# Patient Record
Sex: Female | Born: 1985 | Hispanic: Yes | Marital: Married | Smoking: Never smoker
Health system: Southern US, Community
[De-identification: ages and names within clinical notes are randomized; demographics above are authoritative.]

---

## 2015-07-09 ENCOUNTER — Other Ambulatory Visit (HOSPITAL_COMMUNITY): Payer: Self-pay | Admitting: Nurse Practitioner

## 2015-07-09 DIAGNOSIS — R102 Pelvic and perineal pain: Secondary | ICD-10-CM

## 2015-07-09 DIAGNOSIS — R109 Unspecified abdominal pain: Secondary | ICD-10-CM

## 2015-07-13 ENCOUNTER — Ambulatory Visit (HOSPITAL_COMMUNITY): Admission: RE | Admit: 2015-07-13 | Payer: Self-pay | Source: Ambulatory Visit

## 2015-07-13 ENCOUNTER — Other Ambulatory Visit (HOSPITAL_COMMUNITY): Payer: Self-pay

## 2017-10-01 ENCOUNTER — Other Ambulatory Visit: Payer: Self-pay | Admitting: Adult Health

## 2017-10-16 ENCOUNTER — Other Ambulatory Visit (HOSPITAL_COMMUNITY)
Admission: RE | Admit: 2017-10-16 | Discharge: 2017-10-16 | Disposition: A | Discharge status: Home / Self Care | Payer: BLUE CROSS/BLUE SHIELD | Source: Ambulatory Visit | Attending: Adult Health | Admitting: Adult Health

## 2017-10-16 ENCOUNTER — Encounter: Payer: Self-pay | Admitting: Adult Health

## 2017-10-16 ENCOUNTER — Ambulatory Visit: Payer: BLUE CROSS/BLUE SHIELD | Admitting: Adult Health

## 2017-10-16 VITALS — BP 114/70 | HR 78 | Ht 63.0 in | Wt 175.0 lb

## 2017-10-16 DIAGNOSIS — Z01419 Encounter for gynecological examination (general) (routine) without abnormal findings: Secondary | ICD-10-CM | POA: Insufficient documentation

## 2017-10-16 DIAGNOSIS — Z975 Presence of (intrauterine) contraceptive device: Secondary | ICD-10-CM | POA: Insufficient documentation

## 2017-10-16 DIAGNOSIS — R635 Abnormal weight gain: Secondary | ICD-10-CM

## 2017-10-16 NOTE — Progress Notes (Signed)
Patient ID: Diana Blackburn, female   DOB: [birth year], 31 y.o.   MRN: [id number] History of Present Illness:  Diana Blackburn is a 31 year old female,mexican-american, married in for well woman gyn exam and pap.She is new to this practice.She complains of cramping with IUD, does not have period since inserted, and has vaginal discharge at times. And weight gain of 30 lbs over last 3 years and vision blurry at esp at night.  Current Medications, Allergies, Past Medical History, Past Surgical History, Family History and Social History were reviewed in [employer] record.     Review of Systems:  Patient denies any headaches, hearing loss, fatigue, blurred vision, shortness of breath, chest pain, abdominal pain, problems with bowel movements, urination, or intercourse. No joint pain or mood swings.See HPI for positives    Physical Exam:BP 114/70 (BP Location: Left Arm, Patient Position: Sitting, Cuff Size: Normal)   Pulse 78   Ht 5\' 3"  (1.6 m)   Wt 175 lb (79.4 kg)   BMI 31.00 kg/m  General:  Well developed, well nourished, no acute distress Skin:  Warm and dry Neck:  Midline trachea, normal thyroid, good ROM, no lymphadenopathy Lungs; Clear to auscultation bilaterally Breast:  No dominant palpable mass, retraction, or nipple discharge Cardiovascular: Regular rate and rhythm Abdomen:  Soft, non tender, no hepatosplenomegaly Pelvic:  External genitalia is normal in appearance, no lesions.  The vagina is normal in appearance. Urethra has no lesions or masses. The cervix is bulbous.+IUD strings at os, pap with GC/CHL and HPV performed.  Uterus is felt to be normal size, shape, and contour.  No adnexal masses or tenderness noted.Bladder is non tender, no masses felt. Extremities/musculoskeletal:  No swelling or varicosities noted, no clubbing or cyanosis Psych:  No mood changes, alert and cooperative,seems happy PHQ 2 score 0.She may want tubal in near future and have IUD removed.    Impression: 1. Encounter for gynecological examination with Papanicolaou smear of cervix   2. IUD (intrauterine device) in place   3. Weight gain       Plan: Check CBC,CMP,TSH and lipids Physical in 1 year Pap in 3 if normal Mammogram at 41 Review handout on tubal by [facility]  Call if wants tubal and Angie can check insurance  See eye doctor

## 2017-10-18 LAB — CYTOLOGY - PAP
CHLAMYDIA, DNA PROBE: NEGATIVE
Diagnosis: NEGATIVE
HPV (WINDOPATH): NOT DETECTED
NEISSERIA GONORRHEA: NEGATIVE

## 2018-04-27 DIAGNOSIS — Z6829 Body mass index (BMI) 29.0-29.9, adult: Secondary | ICD-10-CM | POA: Diagnosis not present

## 2018-04-27 DIAGNOSIS — B309 Viral conjunctivitis, unspecified: Secondary | ICD-10-CM | POA: Diagnosis not present

## 2018-04-27 DIAGNOSIS — J0100 Acute maxillary sinusitis, unspecified: Secondary | ICD-10-CM | POA: Diagnosis not present

## 2019-01-14 ENCOUNTER — Encounter: Payer: Self-pay | Admitting: Adult Health

## 2019-01-14 ENCOUNTER — Ambulatory Visit (INDEPENDENT_AMBULATORY_CARE_PROVIDER_SITE_OTHER): Payer: BLUE CROSS/BLUE SHIELD | Admitting: Adult Health

## 2019-01-14 ENCOUNTER — Other Ambulatory Visit: Payer: Self-pay

## 2019-01-14 VITALS — BP 124/90 | HR 70 | Ht 63.0 in | Wt 178.4 lb

## 2019-01-14 DIAGNOSIS — N6313 Unspecified lump in the right breast, lower outer quadrant: Secondary | ICD-10-CM | POA: Insufficient documentation

## 2019-01-14 DIAGNOSIS — Z975 Presence of (intrauterine) contraceptive device: Secondary | ICD-10-CM

## 2019-01-14 DIAGNOSIS — Z01419 Encounter for gynecological examination (general) (routine) without abnormal findings: Secondary | ICD-10-CM | POA: Diagnosis not present

## 2019-01-14 NOTE — Progress Notes (Signed)
Patient ID: Diana Blackburn, female   DOB: 12-Aug-1986, 33 y.o.   MRN: [id number] History of Present Illness: Diana Blackburn is a 33 year old Hispanic female, married, G2P2, in for a well woman gyn exam, she had a normal pap with negative HPV 10/18/17.Has noticed right breast mass for about a month and has pain in breasts at times. PCP is Dr Verdie Drown.    Current Medications, Allergies, Past Medical History, Past Surgical History, Family History and Social History were reviewed in [employer] record.     Review of Systems:  Patient denies any daily headaches,cough or fever, hearing loss, fatigue, blurred vision, shortness of breath, chest pain, abdominal pain, problems with bowel movements, urination, or intercourse. No joint pain or mood swings. +right breast mass x 1 month, and pain in breasts at times. She has IUD(due to be removed in June) and thinks she wants a tubal.   Physical Exam:BP 124/90 (BP Location: Left Arm, Patient Position: Sitting, Cuff Size: Normal)   Pulse 70   Ht 5\' 3"  (1.6 m)   Wt 178 lb 6.4 oz (80.9 kg)   BMI 31.60 kg/m  General:  Well developed, well nourished, no acute distress Skin:  Warm and dry Neck:  Midline trachea, normal thyroid, good ROM, no lymphadenopathy Lungs; Clear to auscultation bilaterally Breast:  No dominant palpable mass, retraction, or nipple discharge on the left, no retraction or nipple discharge on the right, but has pea sized mass at 3-4 o'clock that is tender and about 5 FB from the nipple. Cardiovascular: Regular rate and rhythm Abdomen:  Soft, non tender, no hepatosplenomegaly Pelvic:  External genitalia is normal in appearance, no lesions.  The vagina is normal in appearance. Urethra has no lesions or masses. The cervix is bulbous. +IUD strings.  Uterus is felt to be normal size, shape, and contour.  No adnexal masses or tenderness noted.Bladder is non tender, no masses felt. Extremities/musculoskeletal:  No swelling or  varicosities noted, no clubbing or cyanosis Psych:  No mood changes, alert and cooperative,seems happy Fall risk is low. PHQ 2 score o. Examination chaperoned by Estill Bamberg Rash LPN.  Will get diagnostic mammogram and [place] scheduled.   Impression: 1. Encounter for well woman exam with routine gynecological exam   2. IUD (intrauterine device) in place   3. Mass of lower outer quadrant of right breast       Plan: Diagnostic mammogram and [place] scheduled 3/31 at 9 am at [place] handout on tubal sterilization by Tilden Fossa  Return in 2 months for pre op on tubal with Dr Elonda Husky Pap and physical in 1 year  Labs with PCP

## 2019-01-28 ENCOUNTER — Ambulatory Visit (HOSPITAL_COMMUNITY): Admission: RE | Admit: 2019-01-28 | Payer: BLUE CROSS/BLUE SHIELD | Source: Ambulatory Visit

## 2019-01-28 ENCOUNTER — Ambulatory Visit (HOSPITAL_COMMUNITY)
Admission: RE | Admit: 2019-01-28 | Discharge: 2019-01-28 | Disposition: A | Discharge status: Home / Self Care | Payer: BLUE CROSS/BLUE SHIELD | Source: Ambulatory Visit | Attending: Adult Health | Admitting: Adult Health

## 2019-01-28 ENCOUNTER — Other Ambulatory Visit: Payer: Self-pay

## 2019-01-28 ENCOUNTER — Encounter (HOSPITAL_COMMUNITY): Payer: Self-pay

## 2019-01-28 DIAGNOSIS — N6312 Unspecified lump in the right breast, upper inner quadrant: Secondary | ICD-10-CM | POA: Diagnosis not present

## 2019-01-28 DIAGNOSIS — N6313 Unspecified lump in the right breast, lower outer quadrant: Secondary | ICD-10-CM | POA: Insufficient documentation

## 2019-03-14 ENCOUNTER — Telehealth: Payer: Self-pay | Admitting: Obstetrics & Gynecology

## 2019-03-14 NOTE — Telephone Encounter (Signed)
Spoke with patient and pre screened her for the Covid-19, also I let patient know about our restrictions that we have in place and to please wear a mask for her appointment

## 2019-03-17 ENCOUNTER — Encounter: Payer: Self-pay | Admitting: Obstetrics & Gynecology

## 2019-03-17 ENCOUNTER — Other Ambulatory Visit: Payer: Self-pay

## 2019-03-17 ENCOUNTER — Ambulatory Visit (INDEPENDENT_AMBULATORY_CARE_PROVIDER_SITE_OTHER): Payer: BLUE CROSS/BLUE SHIELD | Admitting: Obstetrics & Gynecology

## 2019-03-17 VITALS — BP 116/78 | HR 77 | Ht 63.0 in | Wt 183.0 lb

## 2019-03-17 DIAGNOSIS — Z3009 Encounter for other general counseling and advice on contraception: Secondary | ICD-10-CM | POA: Diagnosis not present

## 2019-03-17 NOTE — Progress Notes (Signed)
Preoperative History and Physical  Diana Blackburn is a 33 y.o. [id number] with Patient's last menstrual period was 03/11/2019. admitted for a laparoscopic bilateral tubal ligation using electrocautery and removal of IUD.    PMH:   History reviewed. No pertinent past medical history.  PSH:    History reviewed. No pertinent surgical history.  POb/GynH:      OB History    Gravida  2   Para  2   Term      Preterm  2   AB      Living  2     SAB      TAB      Ectopic      Multiple      Live Births  2           SH:   Social History   Tobacco Use  . Smoking status: Never Smoker  . Smokeless tobacco: Never Used  Substance Use Topics  . Alcohol use: No    Frequency: Never  . Drug use: No    FH:    Family History  Problem Relation Age of Onset  . Cancer Paternal Grandfather   . Other Maternal Grandmother        enlarged heart  . Hypertension Maternal Grandmother   . Diabetes Maternal Grandfather      Allergies: No Known Allergies  Medications:       Current Outpatient Medications:  .  levonorgestrel (MIRENA) 20 MCG/24HR IUD, 1 each by Intrauterine route once., Disp: , Rfl:   Review of Systems:   Review of Systems  Constitutional: Negative for fever, chills, weight loss, malaise/fatigue and diaphoresis.  HENT: Negative for hearing loss, ear pain, nosebleeds, congestion, sore throat, neck pain, tinnitus and ear discharge.   Eyes: Negative for blurred vision, double vision, photophobia, pain, discharge and redness.  Respiratory: Negative for cough, hemoptysis, sputum production, shortness of breath, wheezing and stridor.   Cardiovascular: Negative for chest pain, palpitations, orthopnea, claudication, leg swelling and PND.  Gastrointestinal: Positive for abdominal pain. Negative for heartburn, nausea, vomiting, diarrhea, constipation, blood in stool and melena.  Genitourinary: Negative for dysuria, urgency, frequency, hematuria and flank pain.   Musculoskeletal: Negative for myalgias, back pain, joint pain and falls.  Skin: Negative for itching and rash.  Neurological: Negative for dizziness, tingling, tremors, sensory change, speech change, focal weakness, seizures, loss of consciousness, weakness and headaches.  Endo/Heme/Allergies: Negative for environmental allergies and polydipsia. Does not bruise/bleed easily.  Psychiatric/Behavioral: Negative for depression, suicidal ideas, hallucinations, memory loss and substance abuse. The patient is not nervous/anxious and does not have insomnia.      PHYSICAL EXAM:  Blood pressure 116/78, pulse 77, height 5\' 3"  (1.6 m), weight 183 lb (83 kg), last menstrual period 03/11/2019.    Vitals reviewed. Constitutional: She is oriented to person, place, and time. She appears well-developed and well-nourished.  HENT:  Head: Normocephalic and atraumatic.  Right Ear: External ear normal.  Left Ear: External ear normal.  Nose: Nose normal.  Mouth/Throat: Oropharynx is clear and moist.  Eyes: Conjunctivae and EOM are normal. Pupils are equal, round, and reactive to light. Right eye exhibits no discharge. Left eye exhibits no discharge. No scleral icterus.  Neck: Normal range of motion. Neck supple. No tracheal deviation present. No thyromegaly present.  Cardiovascular: Normal rate, regular rhythm, normal heart sounds and intact distal pulses.  Exam reveals no gallop and no friction rub.   No murmur heard. Respiratory: Effort normal and breath sounds normal.  No respiratory distress. She has no wheezes. She has no rales. She exhibits no tenderness.  GI: Soft. Bowel sounds are normal. She exhibits no distension and no mass. There is tenderness. There is no rebound and no guarding.  Genitourinary:       Vulva is normal without lesions Vagina is pink moist without discharge Cervix normal in appearance and pap is normal Uterus is normal size, contour, position, consistency, mobility,  non-tender Adnexa is negative with normal sized ovaries by sonogram  Musculoskeletal: Normal range of motion. She exhibits no edema and no tenderness.  Neurological: She is alert and oriented to person, place, and time. She has normal reflexes. She displays normal reflexes. No cranial nerve deficit. She exhibits normal muscle tone. Coordination normal.  Skin: Skin is warm and dry. No rash noted. No erythema. No pallor.  Psychiatric: She has a normal mood and affect. Her behavior is normal. Judgment and thought content normal.    Labs: No results found for this or any previous visit (from the past 336 hour(s)).  EKG: No orders found for this or any previous visit.  Imaging Studies: No results found.    Assessment: Multiparous female desires permanent sterilization  Plan: Laparoscopic bilateral tubal ligation using electrocautery(patient choice), removal of IUD 05/07/2019  Florian Buff 03/17/2019 4:36 PM

## 2019-04-25 NOTE — Patient Instructions (Signed)
Your procedure is scheduled on: 05/07/2019  Report to [facility] at  10:15     AM.  Call this number if you have problems the morning of surgery: [phone]   Remember:   Do not Eat or Drink after midnight   :    Do not wear jewelry, make-up or nail polish.  Do not wear lotions, powders, or perfumes. You may wear deodorant.  Do not shave 48 hours prior to surgery. Men may shave face and neck.  Do not bring valuables to the hospital.  Contacts, dentures or bridgework may not be worn into surgery.  Leave suitcase in the car. After surgery it may be brought to your room.  For patients admitted to the hospital, checkout time is 11:00 AM the day of discharge.   Patients discharged the day of surgery will not be allowed to drive home.    Special Instructions: Shower using CHG night before surgery and shower the day of surgery use CHG.  Use special wash - you have one bottle of CHG for all showers.  You should use approximately 1/2 of the bottle for each shower.  Laparoscopic Tubal Ligation Laparoscopic tubal ligation is a procedure to close the fallopian tubes. This is done so that you cannot get pregnant. When the fallopian tubes are closed, the eggs that your ovaries release cannot enter the uterus, and sperm cannot reach the released eggs. You should not have this procedure if you want to get pregnant someday or if you are unsure about having more children. Tell a health care provider about:  Any allergies you have.  All medicines you are taking, including vitamins, herbs, eye drops, creams, and over-the-counter medicines.  Any problems you or family members have had with anesthetic medicines.  Any blood disorders you have.  Any surgeries you have had.  Any medical conditions you have.  Whether you are pregnant or may be pregnant.  Any past pregnancies. What are the risks? Generally, this is a safe procedure. However, problems may occur, including:  Infection.  Bleeding.   Injury to other organs in the abdomen.  Side effects from anesthetic medicines.  Failure of the procedure. This procedure can increase your risk of a kind of pregnancy in which a fertilized egg attaches to the outside of the uterus (ectopic pregnancy). What happens before the procedure? Medicines  Ask your health care provider about: ? Changing or stopping your regular medicines. This is especially important if you are taking diabetes medicines or blood thinners. ? Taking medicines such as aspirin and ibuprofen. These medicines can thin your blood. Do not take these medicines unless your health care provider tells you to take them. ? Taking over-the-counter medicines, vitamins, herbs, and supplements. Staying hydrated  Follow instructions from your health care provider about hydration, which may include: ? Up to 2 hours before the procedure - you may continue to drink clear liquids, such as water, clear fruit juice, black coffee, and plain tea. Eating and drinking  Follow instructions from your health care provider about eating and drinking, which may include: ? 8 hours before the procedure - stop eating heavy meals or foods, such as meat, fried foods, or fatty foods. ? 6 hours before the procedure - stop eating light meals or foods, such as toast or cereal. ? 6 hours before the procedure - stop drinking milk or drinks that contain milk. ? 2 hours before the procedure - stop drinking clear liquids. General instructions  Do not use any  products that contain nicotine or tobacco for at least 4 weeks before the procedure. These products include cigarettes, e-cigarettes, and chewing tobacco. If you need help quitting, ask your health care provider.  Plan to have someone take you home from the hospital.  If you will be going home right after the procedure, plan to have someone with you for 24 hours.  Ask your health care provider: ? How your surgery site will be marked. ? What steps  will be taken to help prevent infection. These may include:  Removing hair at the surgery site.  Washing skin with a germ-killing soap.  Taking antibiotic medicine. What happens during the procedure?      An IV will be inserted into one of your veins.  You will be given one or more of the following: ? A medicine to help you relax (sedative). ? A medicine to numb the area (local anesthetic). ? A medicine to make you fall asleep (general anesthetic). ? A medicine that is injected into an area of your body to numb everything below the injection site (regional anesthetic).  Your bladder may be emptied with a small tube (catheter).  If you have been given a general anesthetic, a tube will be put down your throat to help you breathe.  Two small incisions will be made in your lower abdomen and near your belly button.  Your abdomen will be inflated with a gas. This will let the surgeon see better and will give the surgeon room to work.  A thin, lighted tube (laparoscope) with a camera attached will be inserted into your abdomen through one of the incisions. Small instruments will be inserted through the other incision.  The fallopian tubes will be tied off, burned (cauterized), or blocked with a clip, ring, or clamp. A small portion in the center of each fallopian tube may be removed.  The gas will be released from the abdomen.  The incisions will be closed with stitches (sutures).  A bandage (dressing) will be placed over the incisions. The procedure may vary among health care providers and hospitals. What happens after the procedure?  Your blood pressure, heart rate, breathing rate, and blood oxygen level will be monitored until you leave the hospital.  You will be given medicine to help with pain, nausea, and vomiting as needed. Summary  Laparoscopic tubal ligation is a procedure that is done so that you cannot get pregnant.  You should not have this procedure if you want to  get pregnant someday or if you are unsure about having more children.  The procedure is done using a thin, lighted tube (laparoscope) with a camera attached that will be inserted into your abdomen through an incision.  Follow instructions from your health care provider about eating and drinking before the procedure. This information is not intended to replace advice given to you by your health care provider. Make sure you discuss any questions you have with your health care provider. Document Released: 01/22/2001 Document Revised: 09/10/2018 Document Reviewed: 09/10/2018 Elsevier Patient Education  [place].  Laparoscopic Tubal Ligation, Care After This sheet gives you information about how to care for yourself after your procedure. Your health care provider may also give you more specific instructions. If you have problems or questions, contact your health care provider. What can I expect after the procedure? After the procedure, it is common to have:  A sore throat.  Discomfort in your shoulder.  Mild discomfort or cramping in your abdomen.  Gas pains.  Pain or soreness in the area where the surgical incision was made.  A bloated feeling.  Tiredness.  Nausea.  Vomiting. Follow these instructions at home: Medicines  Take over-the-counter and prescription medicines only as told by your health care provider.  Do not take aspirin because it can cause bleeding.  Ask your health care provider if the medicine prescribed to you: ? Requires you to avoid driving or using heavy machinery. ? Can cause constipation. You may need to take actions to prevent or treat constipation, such as:  Drink enough fluid to keep your urine pale yellow.  Take over-the-counter or prescription medicines.  Eat foods that are high in fiber, such as beans, whole grains, and fresh fruits and vegetables.  Limit foods that are high in fat and processed sugars, such as fried or sweet foods.  Incision care      Follow instructions from your health care provider about how to take care of your incision. Make sure you: ? Wash your hands with soap and water before and after you change your bandage (dressing). If soap and water are not available, use hand sanitizer. ? Change your dressing as told by your health care provider. ? Leave stitches (sutures), skin glue, or adhesive strips in place. These skin closures may need to stay in place for 2 weeks or longer. If adhesive strip edges start to loosen and curl up, you may trim the loose edges. Do not remove adhesive strips completely unless your health care provider tells you to do that.  Check your incision area every day for signs of infection. Check for: ? Redness, swelling, or pain. ? Fluid or blood. ? Warmth. ? Pus or a bad smell. Activity  Rest as told by your health care provider.  Avoid sitting for a long time without moving. Get up to take short walks every 1-2 hours. This is important to improve blood flow and breathing. Ask for help if you feel weak or unsteady.  Return to your normal activities as told by your health care provider. Ask your health care provider what activities are safe for you. General instructions  Do not take baths, swim, or use a hot tub until your health care provider approves. Ask your health care provider if you may take showers. You may only be allowed to take sponge baths.  Have someone help you with your daily household tasks for the first few days.  Keep all follow-up visits as told by your health care provider. This is important. Contact a health care provider if:  You have redness, swelling, or pain around your incision.  Your incision feels warm to the touch.  You have pus or a bad smell coming from your incision.  The edges of your incision break open after the sutures have been removed.  Your pain does not improve after 2-3 days.  You have a rash.  You repeatedly become  dizzy or light-headed.  Your pain medicine is not helping. Get help right away if you:  Have a fever.  Faint.  Have increasing pain in your abdomen.  Have severe pain in one or both of your shoulders.  Have fluid or blood coming from your sutures or from your vagina.  Have shortness of breath or difficulty breathing.  Have chest pain or leg pain.  Have ongoing nausea, vomiting, or diarrhea. Summary  After the procedure, it is common to have mild discomfort or cramping in your abdomen.  Take over-the-counter and prescription medicines  only as told by your health care provider.  Watch for symptoms that should prompt you to call your health care provider.  Keep all follow-up visits as told by your health care provider. This is important. This information is not intended to replace advice given to you by your health care provider. Make sure you discuss any questions you have with your health care provider. Document Released: 05/05/2005 Document Revised: 09/10/2018 Document Reviewed: 09/10/2018 Elsevier Patient Education  2020 [place] Anesthesia, Adult, Care After This sheet gives you information about how to care for yourself after your procedure. Your health care provider may also give you more specific instructions. If you have problems or questions, contact your health care provider. What can I expect after the procedure? After the procedure, the following side effects are common:  Pain or discomfort at the IV site.  Nausea.  Vomiting.  Sore throat.  Trouble concentrating.  Feeling cold or chills.  Weak or tired.  Sleepiness and fatigue.  Soreness and body aches. These side effects can affect parts of the body that were not involved in surgery. Follow these instructions at home:  For at least 24 hours after the procedure:  Have a responsible adult stay with you. It is important to have someone help care for you until you are awake and alert.   Rest as needed.  Do not: ? Participate in activities in which you could fall or become injured. ? Drive. ? Use heavy machinery. ? Drink alcohol. ? Take sleeping pills or medicines that cause drowsiness. ? Make important decisions or sign legal documents. ? Take care of children on your own. Eating and drinking  Follow any instructions from your health care provider about eating or drinking restrictions.  When you feel hungry, start by eating small amounts of foods that are soft and easy to digest (bland), such as toast. Gradually return to your regular diet.  Drink enough fluid to keep your urine pale yellow.  If you vomit, rehydrate by drinking water, juice, or clear broth. General instructions  If you have sleep apnea, surgery and certain medicines can increase your risk for breathing problems. Follow instructions from your health care provider about wearing your sleep device: ? Anytime you are sleeping, including during daytime naps. ? While taking prescription pain medicines, sleeping medicines, or medicines that make you drowsy.  Return to your normal activities as told by your health care provider. Ask your health care provider what activities are safe for you.  Take over-the-counter and prescription medicines only as told by your health care provider.  If you smoke, do not smoke without supervision.  Keep all follow-up visits as told by your health care provider. This is important. Contact a health care provider if:  You have nausea or vomiting that does not get better with medicine.  You cannot eat or drink without vomiting.  You have pain that does not get better with medicine.  You are unable to pass urine.  You develop a skin rash.  You have a fever.  You have redness around your IV site that gets worse. Get help right away if:  You have difficulty breathing.  You have chest pain.  You have blood in your urine or stool, or you vomit blood. Summary   After the procedure, it is common to have a sore throat or nausea. It is also common to feel tired.  Have a responsible adult stay with you for the first 24 hours after general  anesthesia. It is important to have someone help care for you until you are awake and alert.  When you feel hungry, start by eating small amounts of foods that are soft and easy to digest (bland), such as toast. Gradually return to your regular diet.  Drink enough fluid to keep your urine pale yellow.  Return to your normal activities as told by your health care provider. Ask your health care provider what activities are safe for you. This information is not intended to replace advice given to you by your health care provider. Make sure you discuss any questions you have with your health care provider. Document Released: 01/22/2001 Document Revised: 06/01/2017 Document Reviewed: 06/01/2017 Elsevier Interactive Patient Education  2019 [employer].

## 2019-05-01 ENCOUNTER — Other Ambulatory Visit: Payer: Self-pay

## 2019-05-01 ENCOUNTER — Encounter (HOSPITAL_COMMUNITY)
Admission: RE | Admit: 2019-05-01 | Discharge: 2019-05-01 | Disposition: A | Discharge status: Home / Self Care | Payer: BLUE CROSS/BLUE SHIELD | Source: Ambulatory Visit | Attending: Obstetrics & Gynecology | Admitting: Obstetrics & Gynecology

## 2019-05-01 ENCOUNTER — Encounter (HOSPITAL_COMMUNITY): Payer: Self-pay

## 2019-05-01 DIAGNOSIS — Z01812 Encounter for preprocedural laboratory examination: Secondary | ICD-10-CM | POA: Diagnosis not present

## 2019-05-01 LAB — CBC
HCT: 38.9 % (ref 36.0–46.0)
Hemoglobin: 12.9 g/dL (ref 12.0–15.0)
MCH: 30.1 pg (ref 26.0–34.0)
MCHC: 33.2 g/dL (ref 30.0–36.0)
MCV: 90.9 fL (ref 80.0–100.0)
Platelets: 302 10*3/uL (ref 150–400)
RBC: 4.28 MIL/uL (ref 3.87–5.11)
RDW: 11.9 % (ref 11.5–15.5)
WBC: 8.6 10*3/uL (ref 4.0–10.5)
nRBC: 0 % (ref 0.0–0.2)

## 2019-05-01 LAB — COMPREHENSIVE METABOLIC PANEL
ALT: 26 U/L (ref 0–44)
AST: 19 U/L (ref 15–41)
Albumin: 4.1 g/dL (ref 3.5–5.0)
Alkaline Phosphatase: 58 U/L (ref 38–126)
Anion gap: 10 (ref 5–15)
BUN: 12 mg/dL (ref 6–20)
CO2: 25 mmol/L (ref 22–32)
Calcium: 9.5 mg/dL (ref 8.9–10.3)
Chloride: 104 mmol/L (ref 98–111)
Creatinine, Ser: 0.53 mg/dL (ref 0.44–1.00)
GFR calc Af Amer: >60 mL/min (ref >60)
GFR calc non Af Amer: >60 mL/min (ref >60)
Glucose, Bld: 94 mg/dL (ref 70–99)
Potassium: 3.9 mmol/L (ref 3.5–5.1)
Sodium: 139 mmol/L (ref 135–145)
Total Bilirubin: 0.9 mg/dL (ref 0.3–1.2)
Total Protein: 7.4 g/dL (ref 6.5–8.1)

## 2019-05-01 LAB — RAPID HIV SCREEN (HIV 1/2 AB+AG)
HIV 1/2 Antibodies: NONREACTIVE
HIV-1 P24 Antigen - HIV24: NONREACTIVE

## 2019-05-01 LAB — URINALYSIS, ROUTINE W REFLEX MICROSCOPIC
Bilirubin Urine: NEGATIVE
Glucose, UA: NEGATIVE mg/dL
Hgb urine dipstick: NEGATIVE
Ketones, ur: 5 mg/dL — AB
Leukocytes,Ua: NEGATIVE
Nitrite: NEGATIVE
Protein, ur: NEGATIVE mg/dL
Specific Gravity, Urine: 1.015 (ref 1.005–1.030)
pH: 7 (ref 5.0–8.0)

## 2019-05-01 LAB — HCG, QUANTITATIVE, PREGNANCY: hCG, Beta Chain, Quant, S: <1 m[IU]/mL (ref <5)

## 2019-05-05 ENCOUNTER — Other Ambulatory Visit: Payer: Self-pay

## 2019-05-05 ENCOUNTER — Other Ambulatory Visit (HOSPITAL_COMMUNITY)
Admission: RE | Admit: 2019-05-05 | Discharge: 2019-05-05 | Disposition: A | Discharge status: Home / Self Care | Payer: BLUE CROSS/BLUE SHIELD | Source: Ambulatory Visit | Attending: Obstetrics & Gynecology | Admitting: Obstetrics & Gynecology

## 2019-05-05 ENCOUNTER — Other Ambulatory Visit: Payer: Self-pay | Admitting: Obstetrics & Gynecology

## 2019-05-05 DIAGNOSIS — Z01812 Encounter for preprocedural laboratory examination: Secondary | ICD-10-CM | POA: Insufficient documentation

## 2019-05-05 DIAGNOSIS — Z1159 Encounter for screening for other viral diseases: Secondary | ICD-10-CM | POA: Insufficient documentation

## 2019-05-06 LAB — SARS CORONAVIRUS 2 (TAT 6-24 HRS): SARS Coronavirus 2: NEGATIVE

## 2019-05-07 ENCOUNTER — Ambulatory Visit (HOSPITAL_COMMUNITY): Payer: BLUE CROSS/BLUE SHIELD | Admitting: Anesthesiology

## 2019-05-07 ENCOUNTER — Other Ambulatory Visit: Payer: Self-pay

## 2019-05-07 ENCOUNTER — Encounter (HOSPITAL_COMMUNITY): Payer: Self-pay | Admitting: *Deleted

## 2019-05-07 ENCOUNTER — Encounter (HOSPITAL_COMMUNITY)
Admission: RE | Disposition: A | Discharge status: Home / Self Care | Payer: Self-pay | Source: Home / Self Care | Attending: Obstetrics & Gynecology

## 2019-05-07 ENCOUNTER — Ambulatory Visit (HOSPITAL_COMMUNITY)
Admission: RE | Admit: 2019-05-07 | Discharge: 2019-05-07 | Disposition: A | Discharge status: Home / Self Care | Payer: BLUE CROSS/BLUE SHIELD | Attending: Obstetrics & Gynecology | Admitting: Obstetrics & Gynecology

## 2019-05-07 DIAGNOSIS — Z30432 Encounter for removal of intrauterine contraceptive device: Secondary | ICD-10-CM | POA: Diagnosis not present

## 2019-05-07 DIAGNOSIS — Z302 Encounter for sterilization: Secondary | ICD-10-CM | POA: Insufficient documentation

## 2019-05-07 HISTORY — PX: LAPAROSCOPIC TUBAL LIGATION: SHX1937

## 2019-05-07 HISTORY — PX: IUD REMOVAL: SHX5392

## 2019-05-07 SURGERY — LIGATION, FALLOPIAN TUBE, LAPAROSCOPIC
Anesthesia: General | Site: Vagina

## 2019-05-07 MED ORDER — HYDROMORPHONE HCL 1 MG/ML IJ SOLN: 0.2500 mg | INTRAMUSCULAR | Status: DC | PRN

## 2019-05-07 MED ORDER — SUGAMMADEX SODIUM 200 MG/2ML IV SOLN
INTRAVENOUS | Status: DC | PRN
  Administered 2019-05-07: 200 mg via INTRAVENOUS

## 2019-05-07 MED ORDER — ROCURONIUM BROMIDE 10 MG/ML (PF) SYRINGE
PREFILLED_SYRINGE | INTRAVENOUS | Status: AC
  Filled 2019-05-07: qty 10

## 2019-05-07 MED ORDER — MIDAZOLAM HCL 2 MG/2ML IJ SOLN: 0.5000 mg | Freq: Once | INTRAMUSCULAR | Status: DC | PRN

## 2019-05-07 MED ORDER — BUPIVACAINE LIPOSOME 1.3 % IJ SUSP
INTRAMUSCULAR | Status: DC | PRN
  Administered 2019-05-07: 20 mL

## 2019-05-07 MED ORDER — MIDAZOLAM HCL 5 MG/5ML IJ SOLN
INTRAMUSCULAR | Status: DC | PRN
  Administered 2019-05-07: 2 mg via INTRAVENOUS

## 2019-05-07 MED ORDER — ONDANSETRON 8 MG PO TBDP: 8.0000 mg | ORAL_TABLET | Freq: Three times a day (TID) | ORAL | 0 refills | Status: DC | PRN

## 2019-05-07 MED ORDER — 0.9 % SODIUM CHLORIDE (POUR BTL) OPTIME
TOPICAL | Status: DC | PRN
  Administered 2019-05-07: 1000 mL

## 2019-05-07 MED ORDER — FENTANYL CITRATE (PF) 100 MCG/2ML IJ SOLN
INTRAMUSCULAR | Status: DC | PRN
  Administered 2019-05-07 (×2): 50 ug via INTRAVENOUS
  Administered 2019-05-07: 100 ug via INTRAVENOUS
  Administered 2019-05-07: 50 ug via INTRAVENOUS

## 2019-05-07 MED ORDER — DEXAMETHASONE SODIUM PHOSPHATE 4 MG/ML IJ SOLN
INTRAMUSCULAR | Status: DC | PRN
  Administered 2019-05-07: 4 mg via INTRAVENOUS

## 2019-05-07 MED ORDER — PROPOFOL 10 MG/ML IV BOLUS
INTRAVENOUS | Status: DC | PRN
  Administered 2019-05-07: 150 mg via INTRAVENOUS

## 2019-05-07 MED ORDER — DEXAMETHASONE SODIUM PHOSPHATE 10 MG/ML IJ SOLN
INTRAMUSCULAR | Status: AC
  Filled 2019-05-07: qty 1

## 2019-05-07 MED ORDER — FENTANYL CITRATE (PF) 250 MCG/5ML IJ SOLN
INTRAMUSCULAR | Status: AC
  Filled 2019-05-07: qty 5

## 2019-05-07 MED ORDER — ONDANSETRON HCL 4 MG/2ML IJ SOLN
INTRAMUSCULAR | Status: AC
  Filled 2019-05-07: qty 2

## 2019-05-07 MED ORDER — PROMETHAZINE HCL 25 MG/ML IJ SOLN: 6.2500 mg | INTRAMUSCULAR | Status: DC | PRN

## 2019-05-07 MED ORDER — MIDAZOLAM HCL 2 MG/2ML IJ SOLN
INTRAMUSCULAR | Status: AC
  Filled 2019-05-07: qty 2

## 2019-05-07 MED ORDER — KETOROLAC TROMETHAMINE 10 MG PO TABS: 10.0000 mg | ORAL_TABLET | Freq: Three times a day (TID) | ORAL | 0 refills | Status: DC | PRN

## 2019-05-07 MED ORDER — ROCURONIUM BROMIDE 100 MG/10ML IV SOLN
INTRAVENOUS | Status: DC | PRN
  Administered 2019-05-07: 50 mg via INTRAVENOUS

## 2019-05-07 MED ORDER — LACTATED RINGERS IV SOLN
INTRAVENOUS | Status: DC
  Administered 2019-05-07: 11:00:00 1000 mL via INTRAVENOUS

## 2019-05-07 MED ORDER — ONDANSETRON HCL 4 MG/2ML IJ SOLN
INTRAMUSCULAR | Status: DC | PRN
  Administered 2019-05-07: 4 mg via INTRAVENOUS

## 2019-05-07 MED ORDER — BUPIVACAINE LIPOSOME 1.3 % IJ SUSP
INTRAMUSCULAR | Status: AC
  Filled 2019-05-07: qty 20

## 2019-05-07 MED ORDER — KETOROLAC TROMETHAMINE 30 MG/ML IJ SOLN
30.0000 mg | Freq: Once | INTRAMUSCULAR | Status: AC
  Administered 2019-05-07: 30 mg via INTRAVENOUS
  Filled 2019-05-07: qty 1

## 2019-05-07 MED ORDER — HYDROCODONE-ACETAMINOPHEN 5-325 MG PO TABS: 1.0000 | ORAL_TABLET | Freq: Four times a day (QID) | ORAL | 0 refills | Status: DC | PRN

## 2019-05-07 MED ORDER — CEFAZOLIN SODIUM-DEXTROSE 2-4 GM/100ML-% IV SOLN
2.0000 g | INTRAVENOUS | Status: AC
  Administered 2019-05-07: 11:00:00 2 g via INTRAVENOUS
  Filled 2019-05-07: qty 100

## 2019-05-07 MED ORDER — HYDROCODONE-ACETAMINOPHEN 7.5-325 MG PO TABS: 1.0000 | ORAL_TABLET | Freq: Once | ORAL | Status: DC | PRN

## 2019-05-07 SURGICAL SUPPLY — 35 items
BLADE SURG SZ11 CARB STEEL (BLADE) ×3 IMPLANT
CLOTH BEACON ORANGE TIMEOUT ST (SAFETY) ×3 IMPLANT
COVER LIGHT HANDLE STERIS (MISCELLANEOUS) ×6 IMPLANT
COVER WAND RF STERILE (DRAPES) ×3 IMPLANT
ELECT REM PT RETURN 9FT ADLT (ELECTROSURGICAL) ×3
ELECTRODE REM PT RTRN 9FT ADLT (ELECTROSURGICAL) ×2 IMPLANT
GAUZE 4X4 16PLY RFD (DISPOSABLE) ×3 IMPLANT
GLOVE BIO SURGEON STRL SZ7 (GLOVE) ×1 IMPLANT
GLOVE BIOGEL PI IND STRL 7.0 (GLOVE) ×6 IMPLANT
GLOVE BIOGEL PI IND STRL 8 (GLOVE) ×2 IMPLANT
GLOVE BIOGEL PI INDICATOR 7.0 (GLOVE) ×3
GLOVE BIOGEL PI INDICATOR 8 (GLOVE) ×1
GLOVE ECLIPSE 8.0 STRL XLNG CF (GLOVE) ×3 IMPLANT
GOWN STRL REUS W/TWL LRG LVL3 (GOWN DISPOSABLE) ×3 IMPLANT
GOWN STRL REUS W/TWL XL LVL3 (GOWN DISPOSABLE) ×3 IMPLANT
KIT TURNOVER CYSTO (KITS) ×3 IMPLANT
MANIFOLD NEPTUNE II (INSTRUMENTS) ×3 IMPLANT
NDL INSUFFLATION 14GA 120MM (NEEDLE) ×2 IMPLANT
NEEDLE HYPO 22GX1.5 SAFETY (NEEDLE) ×3 IMPLANT
NEEDLE INSUFFLATION 14GA 120MM (NEEDLE) ×3 IMPLANT
NS IRRIG 1000ML POUR BTL (IV SOLUTION) ×3 IMPLANT
PACK PERI GYN (CUSTOM PROCEDURE TRAY) ×3 IMPLANT
PAD ARMBOARD 7.5X6 YLW CONV (MISCELLANEOUS) ×3 IMPLANT
SET BASIN LINEN APH (SET/KITS/TRAYS/PACK) ×3 IMPLANT
SOLUTION ANTI FOG 6CC (MISCELLANEOUS) ×3 IMPLANT
SPONGE GAUZE 2X2 8PLY STRL LF (GAUZE/BANDAGES/DRESSINGS) ×5 IMPLANT
STAPLER VISISTAT 35W (STAPLE) ×3 IMPLANT
SUT VICRYL 0 UR6 27IN ABS (SUTURE) ×3 IMPLANT
SYR 10ML LL (SYRINGE) ×3 IMPLANT
SYRINGE 20CC LL (MISCELLANEOUS) ×3 IMPLANT
TAPE PAPER 2X10 WHT MICROPORE (GAUZE/BANDAGES/DRESSINGS) ×1 IMPLANT
TROCAR ENDO BLADELESS 11MM (ENDOMECHANICALS) ×3 IMPLANT
TROCAR XCEL NON-BLD 5MMX100MML (ENDOMECHANICALS) ×3 IMPLANT
TUBING INSUF HEATED (TUBING) ×3 IMPLANT
WARMER LAPAROSCOPE (MISCELLANEOUS) ×3 IMPLANT

## 2019-05-07 NOTE — Anesthesia Postprocedure Evaluation (Signed)
Anesthesia Post Note  Patient: Diana Blackburn  Procedure(s) Performed: LAPAROSCOPIC TUBAL LIGATION (Bilateral Abdomen) INTRAUTERINE DEVICE (IUD) REMOVAL (N/A Vagina )  Patient location during evaluation: PACU Anesthesia Type: General Level of consciousness: awake and alert, oriented and awake Pain management: pain level controlled Vital Signs Assessment: post-procedure vital signs reviewed and stable Respiratory status: spontaneous breathing, respiratory function stable and nonlabored ventilation Cardiovascular status: stable Postop Assessment: no apparent nausea or vomiting Anesthetic complications: no     Last Vitals:  Vitals:   05/07/19 1055 05/07/19 1100  BP: 116/73 120/76  Resp:    Temp:    SpO2:      Last Pain:  Vitals:   05/07/19 1033  TempSrc: Oral  PainSc: 0-No pain                 Deandra Goering

## 2019-05-07 NOTE — Discharge Instructions (Signed)
Laparoscopic Tubal Ligation, Care After This sheet gives you information about how to care for yourself after your procedure. Your health care provider may also give you more specific instructions. If you have problems or questions, contact your health care provider. What can I expect after the procedure? After the procedure, it is common to have:  A sore throat.  Discomfort in your shoulder.  Mild discomfort or cramping in your abdomen.  Gas pains.  Pain or soreness in the area where the surgical incision was made.  A bloated feeling.  Tiredness.  Nausea.  Vomiting. Follow these instructions at home: Medicines  Take over-the-counter and prescription medicines only as told by your health care provider.  Do not take aspirin because it can cause bleeding.  Ask your health care provider if the medicine prescribed to you: ? Requires you to avoid driving or using heavy machinery. ? Can cause constipation. You may need to take actions to prevent or treat constipation, such as:  Drink enough fluid to keep your urine pale yellow.  Take over-the-counter or prescription medicines.  Eat foods that are high in fiber, such as beans, whole grains, and fresh fruits and vegetables.  Limit foods that are high in fat and processed sugars, such as fried or sweet foods. Incision care      Follow instructions from your health care provider about how to take care of your incision. Make sure you: ? Wash your hands with soap and water before and after you change your bandage (dressing). If soap and water are not available, use hand sanitizer. ? Change your dressing as told by your health care provider. ? Leave stitches (sutures), skin glue, or adhesive strips in place. These skin closures may need to stay in place for 2 weeks or longer. If adhesive strip edges start to loosen and curl up, you may trim the loose edges. Do not remove adhesive strips completely unless your health care provider  tells you to do that.  Check your incision area every day for signs of infection. Check for: ? Redness, swelling, or pain. ? Fluid or blood. ? Warmth. ? Pus or a bad smell. Activity  Rest as told by your health care provider.  Avoid sitting for a long time without moving. Get up to take short walks every 1-2 hours. This is important to improve blood flow and breathing. Ask for help if you feel weak or unsteady.  Return to your normal activities as told by your health care provider. Ask your health care provider what activities are safe for you. General instructions  Do not take baths, swim, or use a hot tub until your health care provider approves. Ask your health care provider if you may take showers. You may only be allowed to take sponge baths.  Have someone help you with your daily household tasks for the first few days.  Keep all follow-up visits as told by your health care provider. This is important. Contact a health care provider if:  You have redness, swelling, or pain around your incision.  Your incision feels warm to the touch.  You have pus or a bad smell coming from your incision.  The edges of your incision break open after the sutures have been removed.  Your pain does not improve after 2-3 days.  You have a rash.  You repeatedly become dizzy or light-headed.  Your pain medicine is not helping. Get help right away if you:  Have a fever.  Faint.  Have increasing  pain in your abdomen.  Have severe pain in one or both of your shoulders.  Have fluid or blood coming from your sutures or from your vagina.  Have shortness of breath or difficulty breathing.  Have chest pain or leg pain.  Have ongoing nausea, vomiting, or diarrhea. Summary  After the procedure, it is common to have mild discomfort or cramping in your abdomen.  Take over-the-counter and prescription medicines only as told by your health care provider.  Watch for symptoms that should  prompt you to call your health care provider.  Keep all follow-up visits as told by your health care provider. This is important. This information is not intended to replace advice given to you by your health care provider. Make sure you discuss any questions you have with your health care provider. Document Released: 05/05/2005 Document Revised: 09/10/2018 Document Reviewed: 09/10/2018 Elsevier Patient Education  2020 [employer].

## 2019-05-07 NOTE — Transfer of Care (Signed)
Immediate Anesthesia Transfer of Care Note  Patient: Diana Blackburn  Procedure(s) Performed: LAPAROSCOPIC TUBAL LIGATION (Bilateral Abdomen) INTRAUTERINE DEVICE (IUD) REMOVAL (N/A Vagina )  Patient Location: PACU  Anesthesia Type:General  Level of Consciousness: awake, alert  and oriented  Airway & Oxygen Therapy: Patient Spontanous Breathing  Post-op Assessment: Report given to RN and Post -op Vital signs reviewed and stable  Post vital signs: Reviewed and stable  Last Vitals:  Vitals Value Taken Time  BP    Temp    Pulse    Resp    SpO2      Last Pain:  Vitals:   05/07/19 1033  TempSrc: Oral  PainSc: 0-No pain      Patients Stated Pain Goal: 7 (04/59/97 [id number])  Complications: No apparent anesthesia complications

## 2019-05-07 NOTE — H&P (Signed)
Preoperative History and Physical  Diana Blackburn is a 33 y.o. [id number] with Patient's last menstrual period was 03/11/2019. admitted for a laparoscopic bilateral tubal ligation using electrocautery and removal of IUD.    PMH:   History reviewed. No pertinent past medical history.  PSH:    History reviewed. No pertinent surgical history.  POb/GynH:              OB History    Gravida  2   Para  2   Term      Preterm  2   AB      Living  2     SAB      TAB      Ectopic      Multiple      Live Births  2           SH:   Social History        Tobacco Use  . Smoking status: Never Smoker  . Smokeless tobacco: Never Used  Substance Use Topics  . Alcohol use: No    Frequency: Never  . Drug use: No    FH:         Family History  Problem Relation Age of Onset  . Cancer Paternal Grandfather   . Other Maternal Grandmother        enlarged heart  . Hypertension Maternal Grandmother   . Diabetes Maternal Grandfather      Allergies: No Known Allergies  Medications:       Current Outpatient Medications:  .  levonorgestrel (MIRENA) 20 MCG/24HR IUD, 1 each by Intrauterine route once., Disp: , Rfl:   Review of Systems:   Review of Systems  Constitutional: Negative for fever, chills, weight loss, malaise/fatigue and diaphoresis.  HENT: Negative for hearing loss, ear pain, nosebleeds, congestion, sore throat, neck pain, tinnitus and ear discharge.   Eyes: Negative for blurred vision, double vision, photophobia, pain, discharge and redness.  Respiratory: Negative for cough, hemoptysis, sputum production, shortness of breath, wheezing and stridor.   Cardiovascular: Negative for chest pain, palpitations, orthopnea, claudication, leg swelling and PND.  Gastrointestinal: Positive for abdominal pain. Negative for heartburn, nausea, vomiting, diarrhea, constipation, blood in stool and melena.  Genitourinary: Negative for dysuria, urgency,  frequency, hematuria and flank pain.  Musculoskeletal: Negative for myalgias, back pain, joint pain and falls.  Skin: Negative for itching and rash.  Neurological: Negative for dizziness, tingling, tremors, sensory change, speech change, focal weakness, seizures, loss of consciousness, weakness and headaches.  Endo/Heme/Allergies: Negative for environmental allergies and polydipsia. Does not bruise/bleed easily.  Psychiatric/Behavioral: Negative for depression, suicidal ideas, hallucinations, memory loss and substance abuse. The patient is not nervous/anxious and does not have insomnia.      PHYSICAL EXAM:  Blood pressure 116/78, pulse 77, height 5\' 3"  (1.6 m), weight 183 lb (83 kg), last menstrual period 03/11/2019.    Vitals reviewed. Constitutional: She is oriented to person, place, and time. She appears well-developed and well-nourished.  HENT:  Head: Normocephalic and atraumatic.  Right Ear: External ear normal.  Left Ear: External ear normal.  Nose: Nose normal.  Mouth/Throat: Oropharynx is clear and moist.  Eyes: Conjunctivae and EOM are normal. Pupils are equal, round, and reactive to light. Right eye exhibits no discharge. Left eye exhibits no discharge. No scleral icterus.  Neck: Normal range of motion. Neck supple. No tracheal deviation present. No thyromegaly present.  Cardiovascular: Normal rate, regular rhythm, normal heart sounds and intact distal pulses.  Exam reveals  no gallop and no friction rub.   No murmur heard. Respiratory: Effort normal and breath sounds normal. No respiratory distress. She has no wheezes. She has no rales. She exhibits no tenderness.  GI: Soft. Bowel sounds are normal. She exhibits no distension and no mass. There is tenderness. There is no rebound and no guarding.  Genitourinary:       Vulva is normal without lesions Vagina is pink moist without discharge Cervix normal in appearance and pap is normal Uterus is normal size, contour,  position, consistency, mobility, non-tender Adnexa is negative with normal sized ovaries by sonogram  Musculoskeletal: Normal range of motion. She exhibits no edema and no tenderness.  Neurological: She is alert and oriented to person, place, and time. She has normal reflexes. She displays normal reflexes. No cranial nerve deficit. She exhibits normal muscle tone. Coordination normal.  Skin: Skin is warm and dry. No rash noted. No erythema. No pallor.  Psychiatric: She has a normal mood and affect. Her behavior is normal. Judgment and thought content normal.    Labs: No results found for this or any previous visit (from the past 336 hour(s)).  EKG: No orders found for this or any previous visit.  Imaging Studies: Imaging Results  No results found.      Assessment: Multiparous female desires permanent sterilization  Plan: Laparoscopic bilateral tubal ligation using electrocautery(patient choice), removal of IUD 05/07/2019  Florian Buff 05/07/2019 10:42 AM

## 2019-05-07 NOTE — Anesthesia Preprocedure Evaluation (Signed)
Anesthesia Evaluation  Patient identified by MRN, date of birth, ID band Patient awake  General Assessment Comment:States never had anesthesia in the past  Denies any FH of issues related to anesthesia  Reviewed: Allergy & Precautions, NPO status , Patient's Chart, lab work & pertinent test results  Airway Mallampati: II  TM Distance: >3 FB Neck ROM: Full    Dental no notable dental hx. (+) Teeth Intact   Pulmonary neg pulmonary ROS,    Pulmonary exam normal breath sounds clear to auscultation       Cardiovascular Exercise Tolerance: Good negative cardio ROS Normal cardiovascular examI Rhythm:Regular Rate:Normal     Neuro/Psych negative neurological ROS  negative psych ROS   GI/Hepatic negative GI ROS, Neg liver ROS,   Endo/Other  negative endocrine ROS  Renal/GU negative Renal ROS  negative genitourinary   Musculoskeletal negative musculoskeletal ROS (+)   Abdominal   Peds negative pediatric ROS (+)  Hematology negative hematology ROS (+)   Anesthesia Other Findings   Reproductive/Obstetrics negative OB ROS                             Anesthesia Physical Anesthesia Plan  ASA: I  Anesthesia Plan: General   Post-op Pain Management:    Induction: Intravenous  PONV Risk Score and Plan:   Airway Management Planned: Oral ETT  Additional Equipment:   Intra-op Plan:   Post-operative Plan: Extubation in OR  Informed Consent: I have reviewed the patients History and Physical, chart, labs and discussed the procedure including the risks, benefits and alternatives for the proposed anesthesia with the patient or authorized representative who has indicated his/her understanding and acceptance.     Dental advisory given  Plan Discussed with: CRNA  Anesthesia Plan Comments: (Plan Full PPE use  Plan GETA -d/w pt -WTP with same after Q&A)        Anesthesia Quick Evaluation

## 2019-05-07 NOTE — Op Note (Signed)
Preoperative Diagnosis:  Multiparous female desires permanent sterilization  Postoperative Diagnosis:  Same as above  Procedure:  Laparoscopic Bilateral Tubal Ligation using electrocautery  Surgeon:  Jacelyn Grip MD  Anaesthesia:  LMA  Findings:  Patient had normal pelvic anatomy and no intraperitoneal abnormalities.  Description of Operation:  Patient was taken to the OR and placed into supine position where she underwent LMA anaesthesia.  She was placed in the dorsal lithotomy position and prepped and draped in the usual sterile fashion.  An incision was made in the umbilicus and dissection taken down to the rectus fascia which was incised and opened.  The non bladed trocar was then placed and the peritoneal cavity was insufflated.  The above noted findings were observed. An additional trocar was placed in the LLQ under direct visualization  The Bipolar electrocautery unit was employed and both tubes were burned to no resistance and beyond in the distal ishtmic and ampullary regions, approximately a 3.5 cm segment bilaterally.  There was good hemostasis.  The fascia, peritoneum and subcutaneous tissue were closed using 0 vicryl.  The skin was closed using staples.  The patient was awakened from anaesthesia and taken to the PACU with all counts being correct x 3.  The patient received Ancef 2 gram and Toradol 30 mg IV preoperatively.  Florian Buff, MD 05/07/2019 11:51 AM

## 2019-05-07 NOTE — Anesthesia Procedure Notes (Signed)
Procedure Name: Intubation Date/Time: 05/07/2019 11:13 AM Performed by: Jonna Munro, CRNA Pre-anesthesia Checklist: Patient identified, Emergency Drugs available, Suction available, Patient being monitored and Timeout performed Patient Re-evaluated:Patient Re-evaluated prior to induction Oxygen Delivery Method: Circle system utilized Preoxygenation: Pre-oxygenation with 100% oxygen Induction Type: IV induction Ventilation: Mask ventilation without difficulty Laryngoscope Size: Mac and 3 Grade View: Grade I Tube type: Oral Tube size: 7.0 mm Number of attempts: 1 Placement Confirmation: positive ETCO2,  ETT inserted through vocal cords under direct vision and breath sounds checked- equal and bilateral Secured at: 22 cm Tube secured with: Tape Dental Injury: Teeth and Oropharynx as per pre-operative assessment

## 2019-05-08 ENCOUNTER — Encounter (HOSPITAL_COMMUNITY): Payer: Self-pay | Admitting: Obstetrics & Gynecology

## 2019-05-13 ENCOUNTER — Encounter: Payer: Self-pay | Admitting: Obstetrics & Gynecology

## 2019-05-13 ENCOUNTER — Other Ambulatory Visit: Payer: Self-pay

## 2019-05-13 ENCOUNTER — Ambulatory Visit (INDEPENDENT_AMBULATORY_CARE_PROVIDER_SITE_OTHER): Payer: BLUE CROSS/BLUE SHIELD | Admitting: Obstetrics & Gynecology

## 2019-05-13 VITALS — BP 123/78 | HR 79 | Ht 63.0 in | Wt 175.0 lb

## 2019-05-13 DIAGNOSIS — Z9889 Other specified postprocedural states: Secondary | ICD-10-CM

## 2019-05-13 NOTE — Progress Notes (Signed)
  HPI: Patient returns for routine postoperative follow-up having undergone lap BTl on 05/07/2019.  The patient's immediate postoperative recovery has been unremarkable. Since hospital discharge the patient reports some post op vaginal bleeding.   Current Outpatient Medications: ketorolac (TORADOL) 10 MG tablet, Take 1 tablet (10 mg total) by mouth every 8 (eight) hours as needed., Disp: 15 tablet, Rfl: 0 Multiple Vitamin (MULTIVITAMIN WITH MINERALS) TABS tablet, Take 1 tablet by mouth daily., Disp: , Rfl:  HYDROcodone-acetaminophen (NORCO/VICODIN) 5-325 MG tablet, Take 1 tablet by mouth every 6 (six) hours as needed. (Patient not taking: Reported on 05/13/2019), Disp: 15 tablet, Rfl: 0 ondansetron (ZOFRAN ODT) 8 MG disintegrating tablet, Take 1 tablet (8 mg total) by mouth every 8 (eight) hours as needed for nausea or vomiting. (Patient not taking: Reported on 05/13/2019), Disp: 20 tablet, Rfl: 0  No current facility-administered medications for this visit.     Blood pressure 123/78, pulse 79, height 5\' 3"  (1.6 m), weight 175 lb (79.4 kg).  Physical Exam: 2 incisions are clean dry intact Staples removed  Diagnostic Tests:   Pathology: n/a  Impression: S/p   Plan: Routine post op care  Follow up: prn    Florian Buff, MD

## 2020-03-11 ENCOUNTER — Other Ambulatory Visit: Payer: BLUE CROSS/BLUE SHIELD | Admitting: Adult Health

## 2020-03-19 ENCOUNTER — Other Ambulatory Visit: Payer: BC Managed Care – PPO | Admitting: Adult Health

## 2020-03-19 DIAGNOSIS — D485 Neoplasm of uncertain behavior of skin: Secondary | ICD-10-CM | POA: Diagnosis not present

## 2020-03-19 DIAGNOSIS — B081 Molluscum contagiosum: Secondary | ICD-10-CM | POA: Diagnosis not present

## 2020-04-05 ENCOUNTER — Other Ambulatory Visit (HOSPITAL_COMMUNITY)
Admission: RE | Admit: 2020-04-05 | Discharge: 2020-04-05 | Disposition: A | Discharge status: Home / Self Care | Payer: BC Managed Care – PPO | Source: Ambulatory Visit | Attending: Adult Health | Admitting: Adult Health

## 2020-04-05 ENCOUNTER — Encounter: Payer: Self-pay | Admitting: Adult Health

## 2020-04-05 ENCOUNTER — Ambulatory Visit (INDEPENDENT_AMBULATORY_CARE_PROVIDER_SITE_OTHER): Payer: BC Managed Care – PPO | Admitting: Adult Health

## 2020-04-05 VITALS — BP 121/84 | HR 72 | Ht 63.0 in | Wt 170.5 lb

## 2020-04-05 DIAGNOSIS — Z1272 Encounter for screening for malignant neoplasm of vagina: Secondary | ICD-10-CM

## 2020-04-05 DIAGNOSIS — Z01419 Encounter for gynecological examination (general) (routine) without abnormal findings: Secondary | ICD-10-CM | POA: Diagnosis not present

## 2020-04-05 DIAGNOSIS — R1032 Left lower quadrant pain: Secondary | ICD-10-CM | POA: Diagnosis not present

## 2020-04-05 NOTE — Progress Notes (Signed)
Patient ID: Diana Blackburn, female   DOB: Aug 25, 1986, 34 y.o.   MRN: [id number] History of Present Illness: Diana Blackburn is a 34 year old Hispanic female, married, G2P0202, sp tubal ligation, in for a well woman gyn exam and pap. She is complaining of cramping LLQ sicne having tubal 05/07/2019, and it is not just associated with period can be any time. PCP is Dr Verdie Drown.   Current Medications, Allergies, Past Medical History, Past Surgical History, Family History and Social History were reviewed in [employer] record.     Review of Systems: Patient denies any headaches, hearing loss, fatigue, blurred vision, shortness of breath, chest pain, problems with bowel movements(has some constipation), urination, or intercourse. No joint pain or mood swings. Has cramping LLQ since having tubal ligation 05/07/2019.    Physical Exam:BP 121/84 (BP Location: Left Arm, Patient Position: Sitting, Cuff Size: Normal)   Pulse 72   Ht 5\' 3"  (1.6 m)   Wt 170 lb 8 oz (77.3 kg)   LMP 03/12/2020   BMI 30.20 kg/m  General:  Well developed, well nourished, no acute distress Skin:  Warm and dry Neck:  Midline trachea, normal thyroid, good ROM, no lymphadenopathy Lungs; Clear to auscultation bilaterally Breast:  No dominant palpable mass, retraction, or nipple discharge Cardiovascular: Regular rate and rhythm Abdomen:  Soft, non tender, no hepatosplenomegaly Pelvic:  External genitalia is normal in appearance, no lesions.  The vagina is normal in appearance. Urethra has no lesions or masses. The cervix is bulbous.Pap with GC/CHL and  high risk HPV 16/18 genotyping performed.  Uterus is felt to be normal size, shape, and contour.  No adnexal masses or tenderness noted.Bladder is non tender, no masses felt. Extremities/musculoskeletal:  No swelling or varicosities noted, no clubbing or cyanosis Psych:  No mood changes, alert and cooperative,seems happy AA is 1 Fall risk is low PHQ 9 score is  2 Examination chaperoned by Levy Pupa LPN  Impression and Plan: 1. Encounter for gynecological examination with Papanicolaou smear of cervix Pap sent Physical in 1 year Pap in 3 if normal Labs with PCP   2. LLQ pain Discussed could be ovulation pain or a cyst on her ovary, will get GYN [place] in about 3 weeks when period over to assess

## 2020-04-06 LAB — CYTOLOGY - PAP
Adequacy: ABSENT
Chlamydia: NEGATIVE
Comment: NEGATIVE
Comment: NEGATIVE
Comment: NORMAL
Diagnosis: NEGATIVE
High risk HPV: NEGATIVE
Neisseria Gonorrhea: NEGATIVE

## 2020-04-20 DIAGNOSIS — B081 Molluscum contagiosum: Secondary | ICD-10-CM | POA: Diagnosis not present

## 2020-04-20 DIAGNOSIS — D23112 Other benign neoplasm of skin of right lower eyelid, including canthus: Secondary | ICD-10-CM | POA: Diagnosis not present

## 2020-04-20 DIAGNOSIS — D23122 Other benign neoplasm of skin of left lower eyelid, including canthus: Secondary | ICD-10-CM | POA: Diagnosis not present

## 2020-04-20 DIAGNOSIS — D23111 Other benign neoplasm of skin of right upper eyelid, including canthus: Secondary | ICD-10-CM | POA: Diagnosis not present

## 2020-04-20 DIAGNOSIS — D23121 Other benign neoplasm of skin of left upper eyelid, including canthus: Secondary | ICD-10-CM | POA: Diagnosis not present

## 2020-04-26 ENCOUNTER — Ambulatory Visit (INDEPENDENT_AMBULATORY_CARE_PROVIDER_SITE_OTHER): Payer: BC Managed Care – PPO

## 2020-04-26 DIAGNOSIS — N854 Malposition of uterus: Secondary | ICD-10-CM | POA: Diagnosis not present

## 2020-04-26 DIAGNOSIS — R1032 Left lower quadrant pain: Secondary | ICD-10-CM | POA: Diagnosis not present

## 2020-04-26 DIAGNOSIS — D259 Leiomyoma of uterus, unspecified: Secondary | ICD-10-CM | POA: Diagnosis not present

## 2020-04-26 NOTE — Progress Notes (Signed)
PELVIC US TA/TV: heterogeneous anteverted uterus,posterior fundal calcified subserosal fibroid 1.7 x 1.3 x 1.7 cm,EEC 6.9 mm,normal ovaries,ovaries appear mobile,small amount of simple cul de sac fluid

## 2020-04-28 ENCOUNTER — Telehealth: Payer: Self-pay | Admitting: Adult Health

## 2020-04-28 NOTE — Telephone Encounter (Signed)
Pt aware of [place], small fibroid, and small cyst the left ovary, if desires, could try low dose OCs can let me know.

## 2020-07-02 DIAGNOSIS — J189 Pneumonia, unspecified organism: Secondary | ICD-10-CM | POA: Diagnosis not present

## 2020-07-02 DIAGNOSIS — R05 Cough: Secondary | ICD-10-CM | POA: Diagnosis not present

## 2020-07-02 DIAGNOSIS — R509 Fever, unspecified: Secondary | ICD-10-CM | POA: Diagnosis not present

## 2020-07-02 DIAGNOSIS — R918 Other nonspecific abnormal finding of lung field: Secondary | ICD-10-CM | POA: Diagnosis not present

## 2020-07-02 DIAGNOSIS — U071 COVID-19: Secondary | ICD-10-CM | POA: Diagnosis not present

## 2020-07-02 DIAGNOSIS — M549 Dorsalgia, unspecified: Secondary | ICD-10-CM | POA: Diagnosis not present

## 2020-07-02 DIAGNOSIS — J1282 Pneumonia due to coronavirus disease 2019: Secondary | ICD-10-CM | POA: Diagnosis not present

## 2020-07-02 DIAGNOSIS — R0602 Shortness of breath: Secondary | ICD-10-CM | POA: Diagnosis not present

## 2020-08-25 ENCOUNTER — Institutional Professional Consult (permissible substitution): Payer: BC Managed Care – PPO | Admitting: Pulmonary Disease

## 2021-04-06 ENCOUNTER — Ambulatory Visit (INDEPENDENT_AMBULATORY_CARE_PROVIDER_SITE_OTHER): Payer: BC Managed Care – PPO | Admitting: Adult Health

## 2021-04-06 ENCOUNTER — Other Ambulatory Visit: Payer: Self-pay

## 2021-04-06 ENCOUNTER — Encounter: Payer: Self-pay | Admitting: Adult Health

## 2021-04-06 VITALS — BP 133/81 | HR 84 | Ht 63.0 in | Wt 166.0 lb

## 2021-04-06 DIAGNOSIS — N6310 Unspecified lump in the right breast, unspecified quadrant: Secondary | ICD-10-CM

## 2021-04-06 DIAGNOSIS — Z01419 Encounter for gynecological examination (general) (routine) without abnormal findings: Secondary | ICD-10-CM | POA: Diagnosis not present

## 2021-04-06 DIAGNOSIS — N644 Mastodynia: Secondary | ICD-10-CM

## 2021-04-06 NOTE — Progress Notes (Signed)
Patient ID: Diana Blackburn, female   DOB: Oct 20, 1986, 35 y.o.   MRN: [id number] History of Present Illness:  Diana Blackburn is a 35 year old white female,married, G2P0202,in for a well woman gyn exam. She has bilateral breast pain at times, L>R. Lab Results  Component Value Date   DIAGPAP  04/05/2020    - Negative for intraepithelial lesion or malignancy (NILM)   HPV NOT DETECTED 10/16/2017   [place] Negative 04/05/2020  PCP is Dr Verdie Drown  Current Medications, Allergies, Past Medical History, Past Surgical History, Family History and Social History were reviewed in [facility] record.     Review of Systems:  Patient denies any headaches, hearing loss, fatigue, blurred vision, shortness of breath, chest pain, abdominal pain, problems with bowel movements, urination, or intercourse. No joint pain or mood swings. +breast pain L>R and ?knot right breast seems bigger    Physical Exam:BP 133/81 (BP Location: Right Arm, Patient Position: Sitting, Cuff Size: Normal)   Pulse 84   Ht 5\' 3"  (1.6 m)   Wt 166 lb (75.3 kg)   LMP 03/16/2021 (Exact Date)   BMI 29.41 kg/m  General:  Well developed, well nourished, no acute distress Skin:  Warm and dry Neck:  Midline trachea, normal thyroid, good ROM, no lymphadenopathy Lungs; Clear to auscultation bilaterally Breast:  No dominant palpable mass, retraction, or nipple discharge, on the left but tender UOQ, and on right breast no retraction or nipple discahrge but has marble sized mass at 2 0' clock and thickness at 11 0'clock. Cardiovascular: Regular rate and rhythm Abdomen:  Soft, non tender, no hepatosplenomegaly Pelvic:  External genitalia is normal in appearance, no lesions.  The vagina is normal in appearance. Urethra has no lesions or masses. The cervix is bulbous.  Uterus is felt to be normal size, shape, and contour.  No adnexal masses or tenderness noted.Bladder is non tender, no masses felt. Rectal: Deferred   Extremities/musculoskeletal:  No swelling or varicosities noted, no clubbing or cyanosis Psych:  No mood changes, alert and cooperative,seems happy AA is 2 Fall risk is low Depression screen [facility] 2/9 04/06/2021 04/05/2020 01/14/2019  Decreased Interest 1 0 0  Down, Depressed, Hopeless 0 0 0  PHQ - 2 Score 1 0 0  Altered sleeping 0 0 -  Tired, decreased energy 0 1 -  Change in appetite 0 0 -  Feeling bad or failure about yourself  0 1 -  Trouble concentrating 0 0 -  Moving slowly or fidgety/restless 0 0 -  Suicidal thoughts 0 0 -  PHQ-9 Score 1 2 -  Difficult doing work/chores - Not difficult at all -   GAD 7 : Generalized Anxiety Score 04/06/2021 04/05/2020  Nervous, Anxious, on Edge 0 0  Control/stop worrying 0 1  Worry too much - different things 0 1  Trouble relaxing 0 0  Restless 0 0  Easily annoyed or irritable 0 0  Afraid - awful might happen 0 1  Total GAD 7 Score 0 3  Anxiety Difficulty - Not difficult at all    [facility] - 04/06/21 6761      Pregnancy Intention Screening   Does the patient want to become pregnant in the next year? No    Does the patient's partner want to become pregnant in the next year? No    Would the patient like to discuss contraceptive options today? No      Contraception Wrap Up   Current Method Female Sterilization   BTL  End Method Female Sterilization   BTL   Contraception Counseling Provided No         Examination chaperoned by Celene Squibb LPN  Impression and Plan: 1. Encounter for well woman exam with routine gynecological exam Physical in 1 year Labs with PCP Pap 2024  2. Breast mass, right Will get diagnostic bilateral mammogram and [place] 6/28 at 1:40 pm at [facility]  3. Breast pain Will get diagnostic mammogram and [place]

## 2021-04-26 ENCOUNTER — Ambulatory Visit (HOSPITAL_COMMUNITY)
Admission: RE | Admit: 2021-04-26 | Discharge: 2021-04-26 | Disposition: A | Discharge status: Home / Self Care | Payer: BC Managed Care – PPO | Source: Ambulatory Visit | Attending: Adult Health | Admitting: Adult Health

## 2021-04-26 ENCOUNTER — Other Ambulatory Visit: Payer: Self-pay

## 2021-04-26 DIAGNOSIS — N644 Mastodynia: Secondary | ICD-10-CM | POA: Diagnosis not present

## 2021-04-26 DIAGNOSIS — N6310 Unspecified lump in the right breast, unspecified quadrant: Secondary | ICD-10-CM | POA: Diagnosis not present

## 2021-04-26 DIAGNOSIS — R922 Inconclusive mammogram: Secondary | ICD-10-CM | POA: Diagnosis not present

## 2021-06-14 ENCOUNTER — Ambulatory Visit (INDEPENDENT_AMBULATORY_CARE_PROVIDER_SITE_OTHER): Payer: BC Managed Care – PPO | Admitting: Adult Health

## 2021-06-14 ENCOUNTER — Other Ambulatory Visit: Payer: Self-pay

## 2021-06-14 ENCOUNTER — Encounter: Payer: Self-pay | Admitting: Adult Health

## 2021-06-14 VITALS — BP 113/78 | HR 74 | Ht 63.0 in | Wt 168.0 lb

## 2021-06-14 DIAGNOSIS — N907 Vulvar cyst: Secondary | ICD-10-CM | POA: Diagnosis not present

## 2021-06-14 NOTE — Progress Notes (Signed)
  Subjective:     Patient ID: Diana Blackburn, female   DOB: September 05, 1986, 35 y.o.   MRN: [id number]  HPI Diana Blackburn is a 35 year old female, married, G2P0202, in complaining of bump left labia, noticed Saturday in the shower.  Lab Results  Component Value Date   DIAGPAP  04/05/2020    - Negative for intraepithelial lesion or malignancy (NILM)   HPV NOT DETECTED 10/16/2017   [place] Negative 04/05/2020   PCP is Dr Verdie Drown  Review of Systems Bump left labia, seems smaller today Reviewed past medical,surgical, social and family history. Reviewed medications and allergies.     Objective:   Physical Exam BP 113/78 (BP Location: Left Arm, Patient Position: Sitting, Cuff Size: Normal)   Pulse 74   Ht '5\' 3"'$  (1.6 m)   Wt 168 lb (76.2 kg)   LMP 05/16/2021 (Approximate)   BMI 29.76 kg/m  Skin warm and dry.Pelvic: external genitalia is normal in appearance, has .5 cm papule inner left labia,and tiny mole below it  Examination chaperoned by Levy Pupa LPN Fall risk is low  Upstream - 06/14/21 1421       Pregnancy Intention Screening   Does the patient want to become pregnant in the next year? No    Does the patient's partner want to become pregnant in the next year? No    Would the patient like to discuss contraceptive options today? No      Contraception Wrap Up   Current Method Female Sterilization    End Method Female Sterilization    Contraception Counseling Provided No                Assessment:     1. Epidermal cyst of vulva Can try warm compress and light pressure, but these usually do not express much So just watch I showed her pictures in genital dermatology atlas     Plan:     Follow up prn

## 2021-11-13 IMAGING — US US BREAST*R* LIMITED INC AXILLA
1 series · 10 of 10 positions shown · non-contrast
Comparison: Previous exams.

CLINICAL DATA: [age] year old female with a palpable area of
concern in the inner right breast and nonfocal tenderness involving
the left breast. In addition, patient's provider feels a palpable
area of concern in the upper-outer right breast.

EXAM:
DIGITAL DIAGNOSTIC BILATERAL MAMMOGRAM WITH TOMOSYNTHESIS AND CAD;
ULTRASOUND RIGHT BREAST LIMITED
TECHNIQUE: Bilateral digital diagnostic mammography and breast tomosynthesis
was performed. The images were evaluated with computer-aided
detection.; Targeted ultrasound examination of the right breast was
performed

[Series 1: us breast*right* limited inc axilla · 0.07mm/px · 10 of 10 slices shown]
[im 1/10]
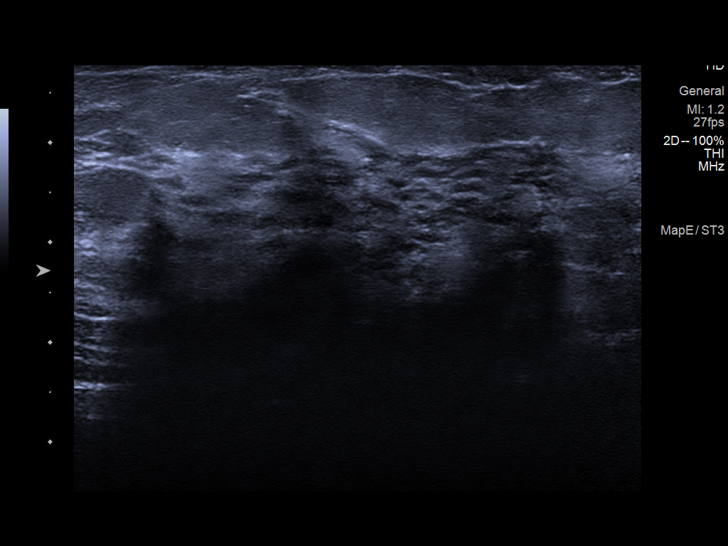
[im 2/10]
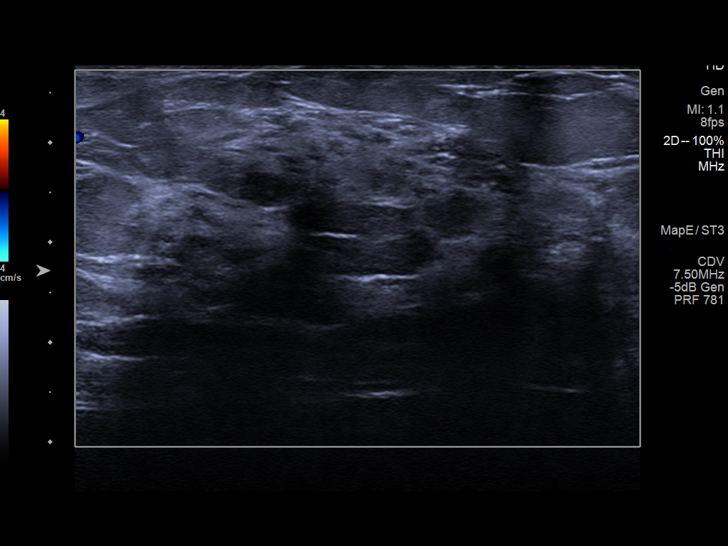
[im 3/10]
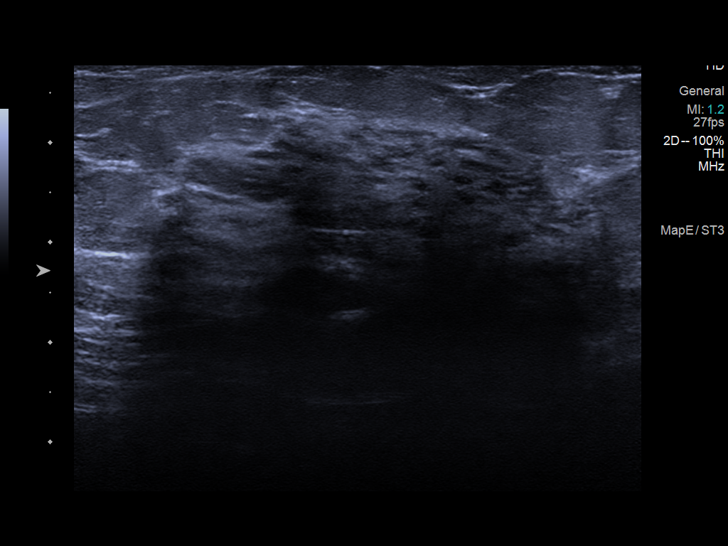
[im 4/10]
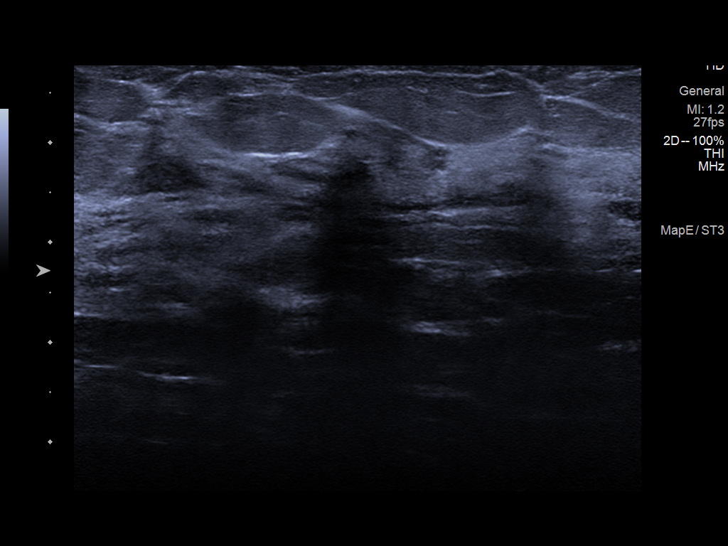
[im 5/10]
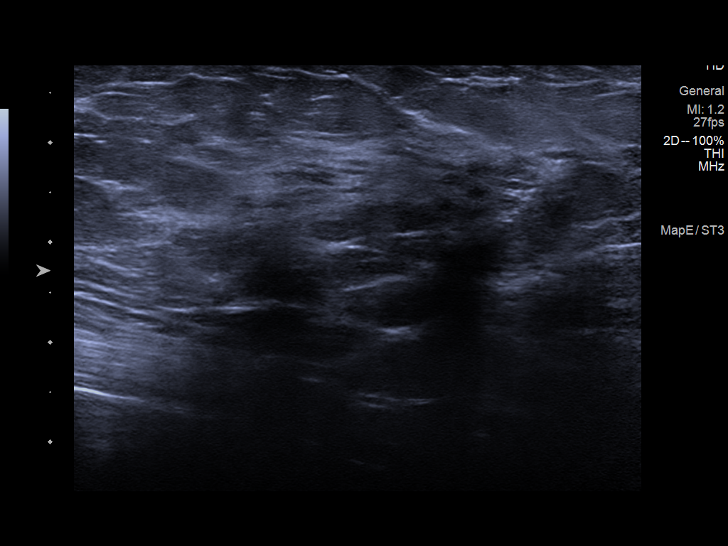
[im 6/10]
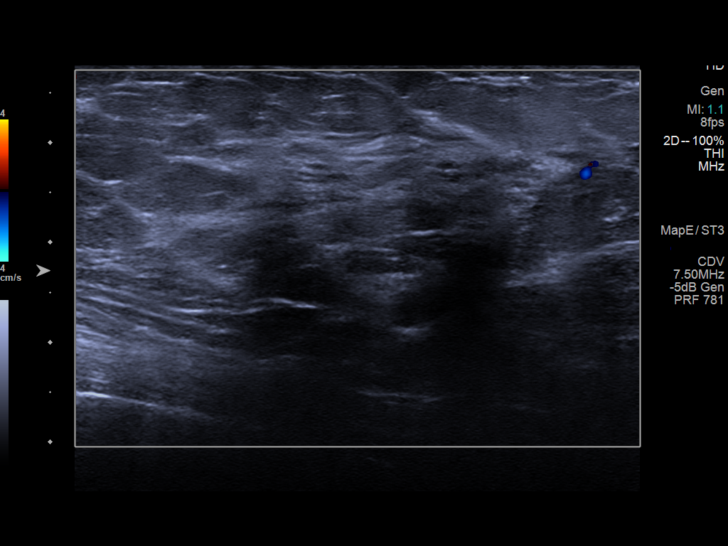
[im 7/10]
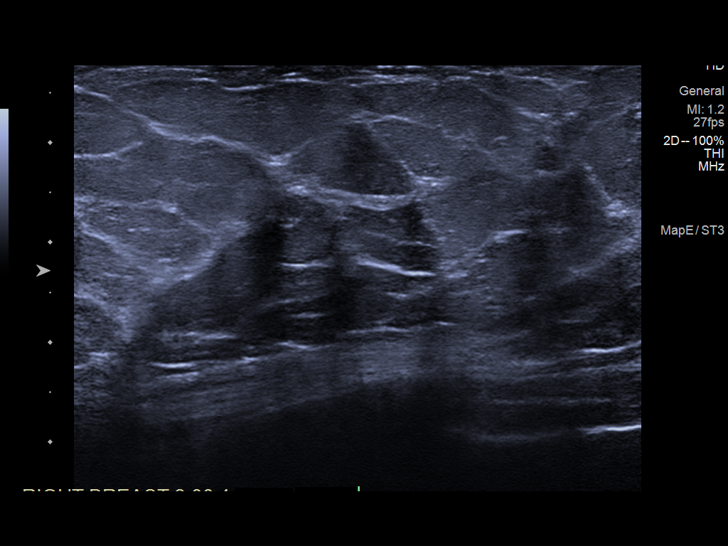
[im 8/10]
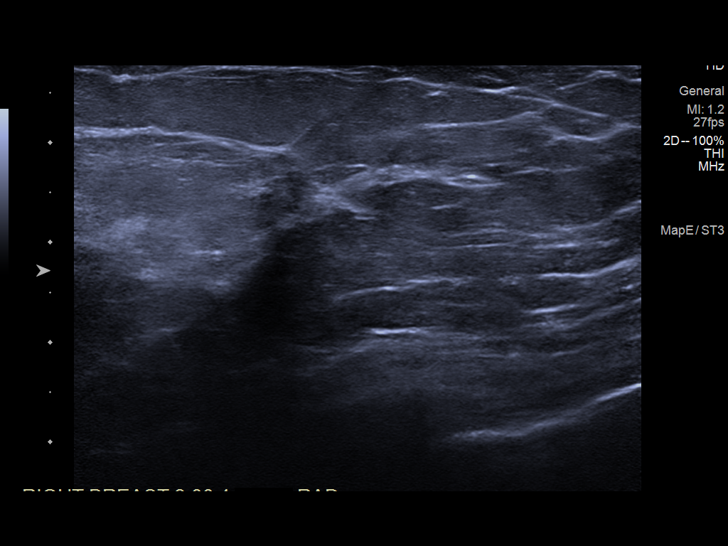
[im 9/10]
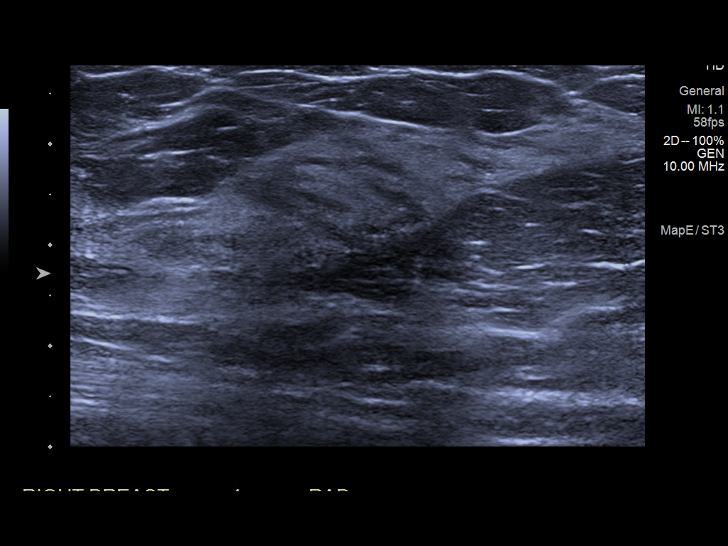
[im 10/10]
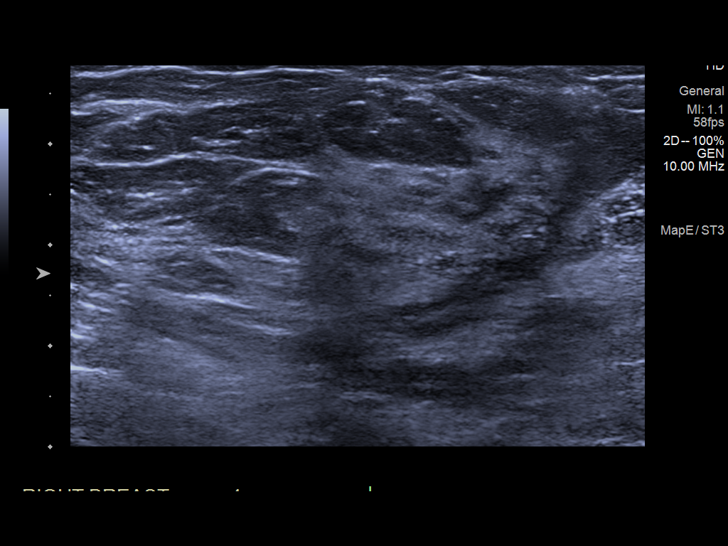

[10 of 10 positions shown; findings below may reference images not displayed]

ACR Breast Density Category c: The breast tissue is heterogeneously
dense, which may obscure small masses.
FINDINGS: No suspicious masses or calcifications are seen in either breast.
Spot compression tomograms were performed over the palpable area of
concern in the inner right breast with no definite abnormality seen.

Physical examination at site of palpable concern in the inner right
breast reveals soft mobile thickening however no underlying palpable
masses.

Targeted ultrasound of the right breast in region of palpable
concern was performed. No suspicious masses or abnormality seen in
the region of palpable concern in the slightly upper inner right
breast. Sonographic evaluation of the upper-outer quadrant of the
right breast was also performed. No suspicious masses or abnormality
seen, only dense fibroglandular tissue identified.
IMPRESSION: 1. No mammographic or sonographic abnormalities at site of palpable
concern in the upper inner right breast or upper-outer right breast.

2.  No findings of malignancy in either breast.

RECOMMENDATION:
1. Recommend further management of the right breast palpable areas
of concern be based on clinical assessment.

2. Screening mammogram at age 41 unless there are persistent or
intervening clinical concerns. (Code:5D-4-4MH)

I have discussed the findings and recommendations with the patient.
If applicable, a reminder letter will be sent to the patient
regarding the next appointment.

BI-RADS CATEGORY  1: Negative.

## 2021-11-13 IMAGING — MG DIGITAL DIAGNOSTIC BILAT W/ TOMO W/ CAD
6 of 12 series · 6 of 36 positions shown · non-contrast
Comparison: Previous exams.

CLINICAL DATA: [age] year old female with a palpable area of
concern in the inner right breast and nonfocal tenderness involving
the left breast. In addition, patient's provider feels a palpable
area of concern in the upper-outer right breast.

EXAM:
DIGITAL DIAGNOSTIC BILATERAL MAMMOGRAM WITH TOMOSYNTHESIS AND CAD;
ULTRASOUND RIGHT BREAST LIMITED
TECHNIQUE: Bilateral digital diagnostic mammography and breast tomosynthesis
was performed. The images were evaluated with computer-aided
detection.; Targeted ultrasound examination of the right breast was
performed

[R CC synth-2D (1 of 2)]
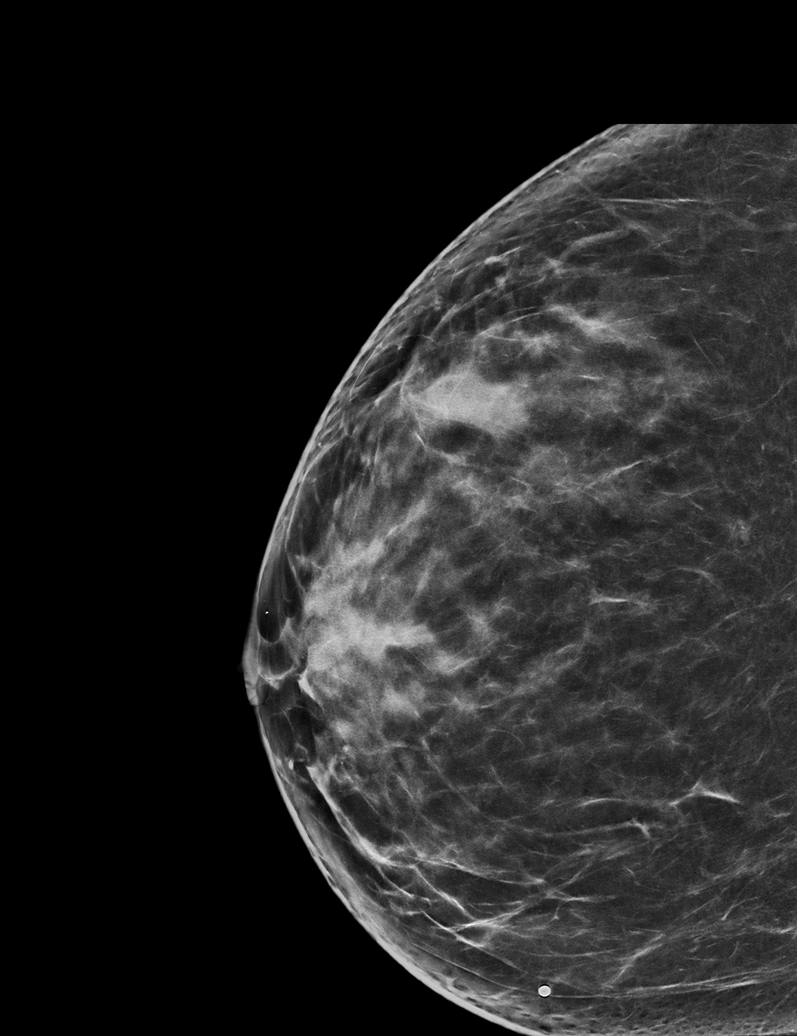

[R CC synth-2D (2 of 2)]
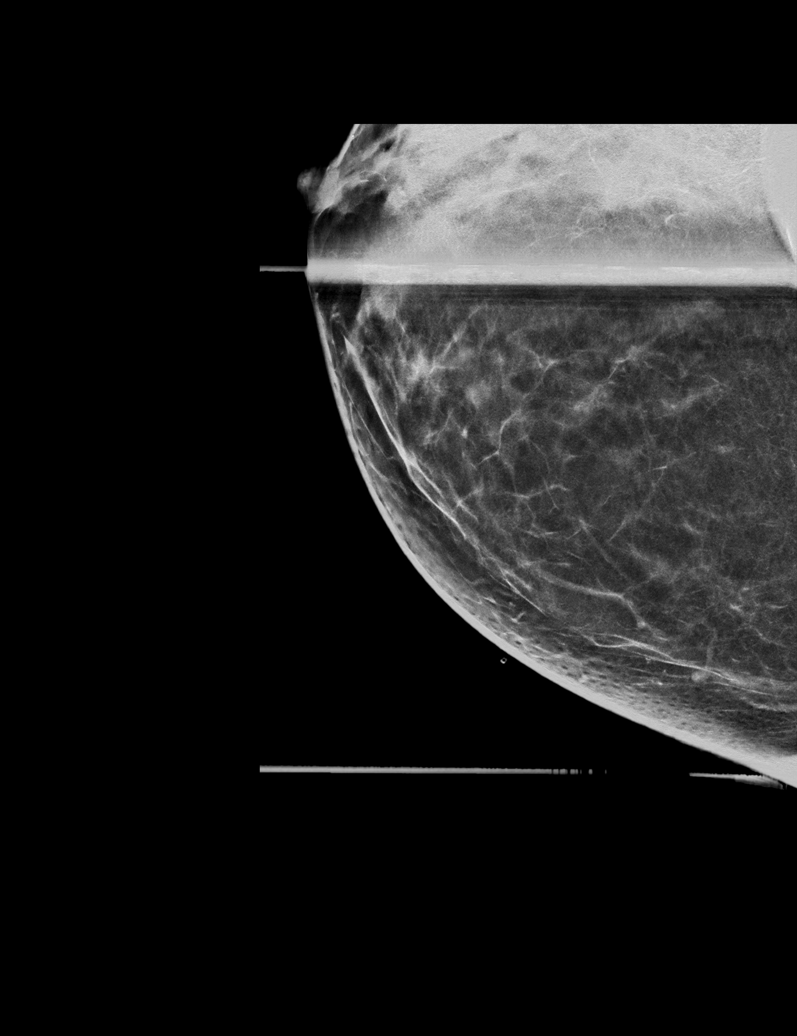

[R MLO synth-2D (1 of 2)]
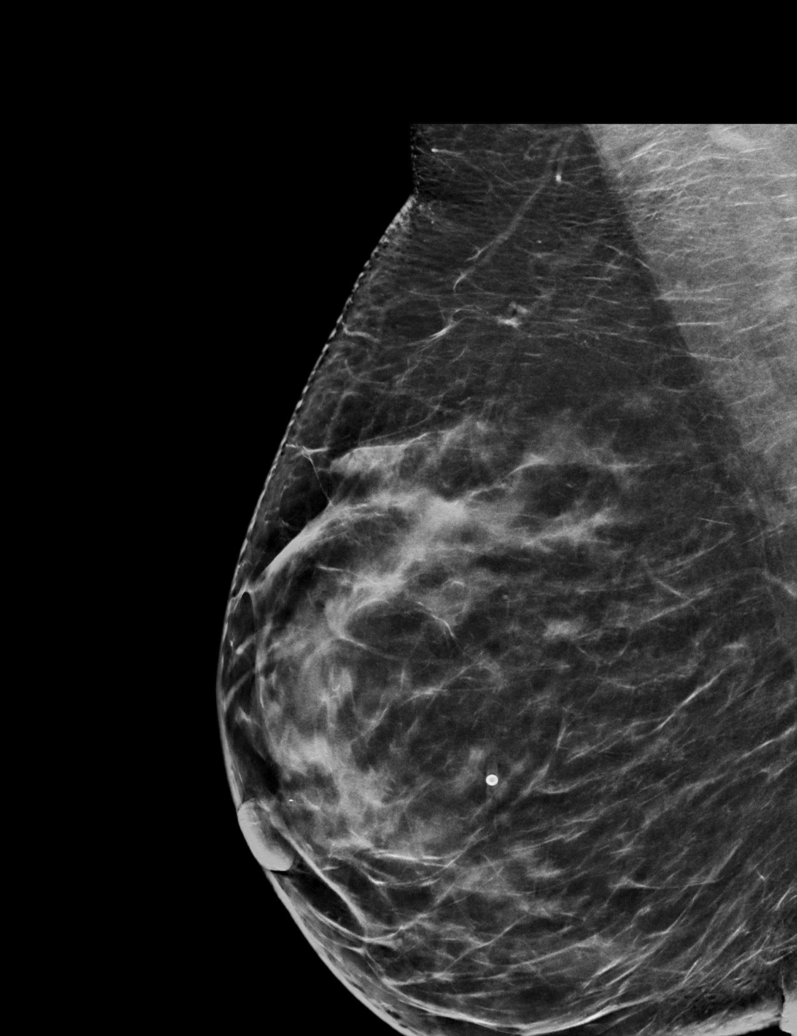

[L MLO synth-2D]
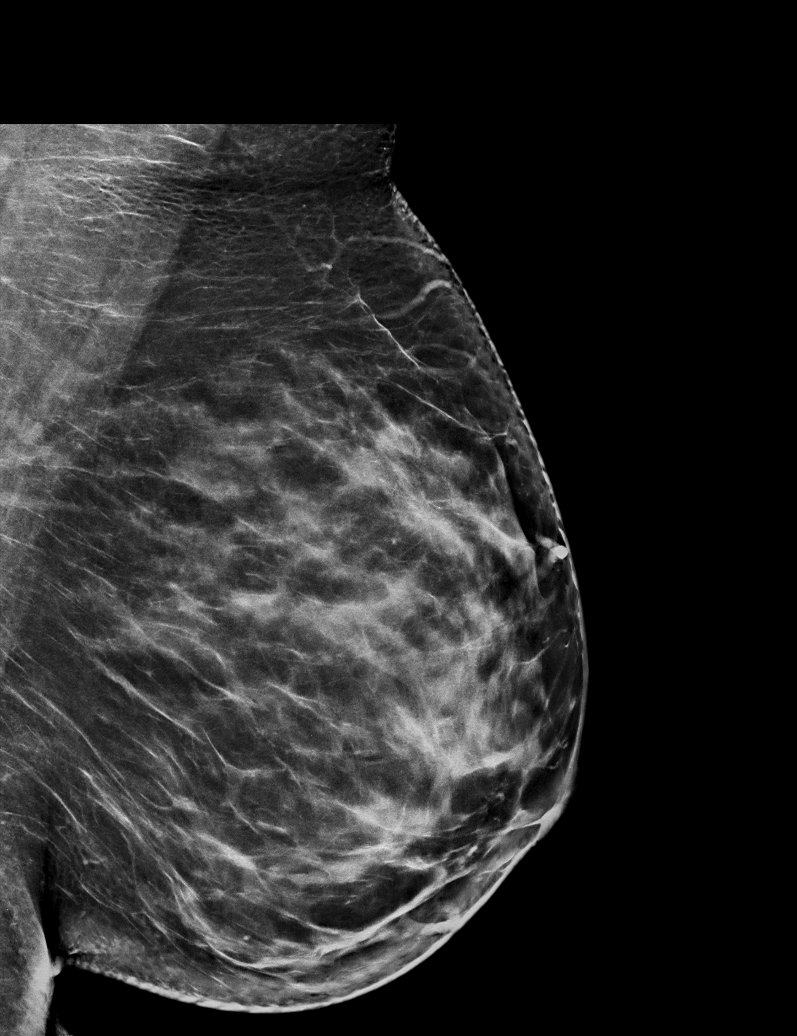

[L CC synth-2D]
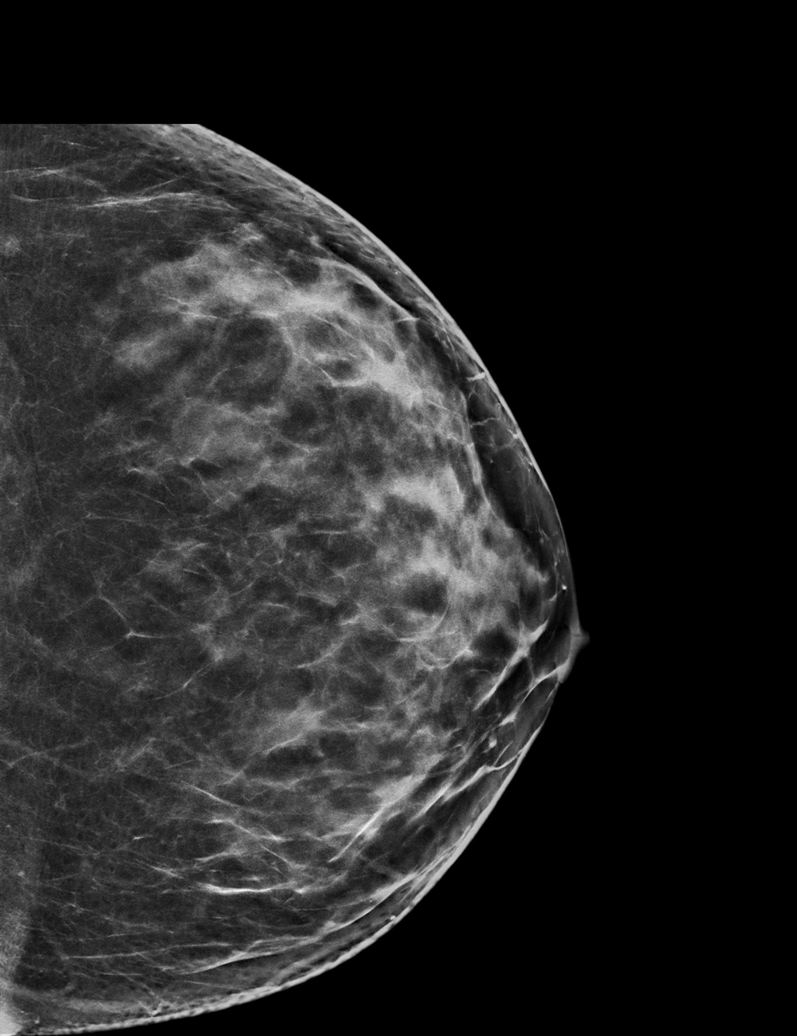

[R MLO synth-2D (2 of 2)]
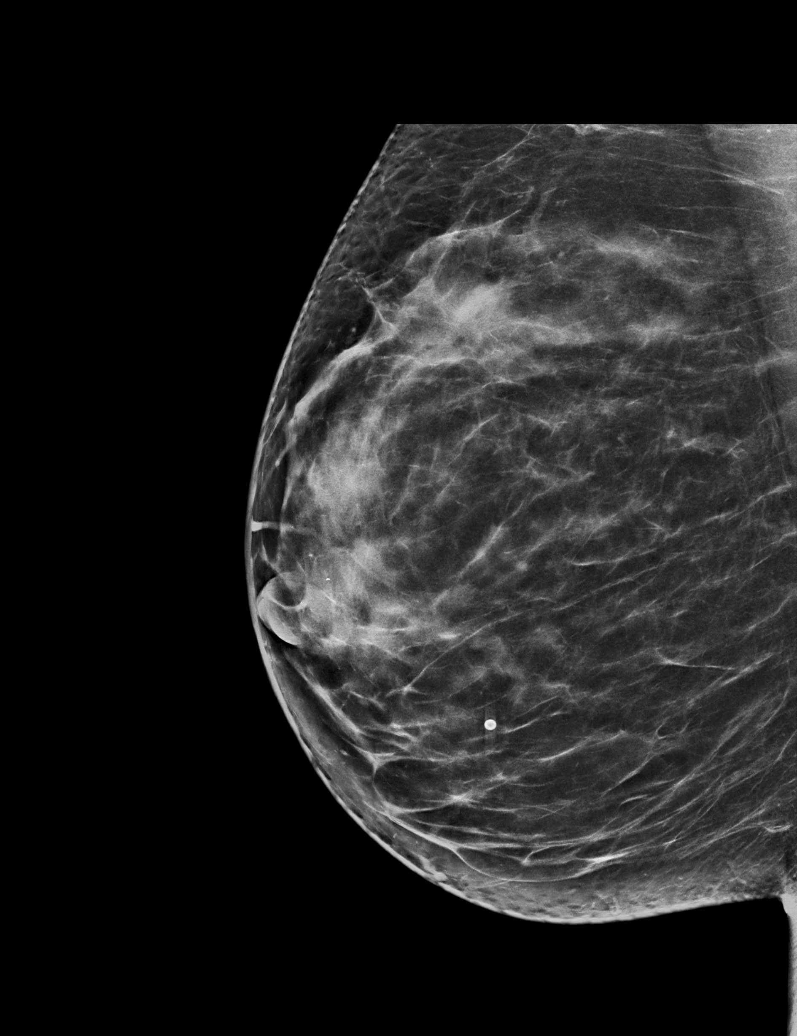

[6 of 36 positions shown; findings below may reference images not displayed]

ACR Breast Density Category c: The breast tissue is heterogeneously
dense, which may obscure small masses.
FINDINGS: No suspicious masses or calcifications are seen in either breast.
Spot compression tomograms were performed over the palpable area of
concern in the inner right breast with no definite abnormality seen.

Physical examination at site of palpable concern in the inner right
breast reveals soft mobile thickening however no underlying palpable
masses.

Targeted ultrasound of the right breast in region of palpable
concern was performed. No suspicious masses or abnormality seen in
the region of palpable concern in the slightly upper inner right
breast. Sonographic evaluation of the upper-outer quadrant of the
right breast was also performed. No suspicious masses or abnormality
seen, only dense fibroglandular tissue identified.
IMPRESSION: 1. No mammographic or sonographic abnormalities at site of palpable
concern in the upper inner right breast or upper-outer right breast.

2.  No findings of malignancy in either breast.

RECOMMENDATION:
1. Recommend further management of the right breast palpable areas
of concern be based on clinical assessment.

2. Screening mammogram at age 41 unless there are persistent or
intervening clinical concerns. (Code:5D-4-4MH)

I have discussed the findings and recommendations with the patient.
If applicable, a reminder letter will be sent to the patient
regarding the next appointment.

BI-RADS CATEGORY  1: Negative.

## 2022-01-03 ENCOUNTER — Ambulatory Visit (INDEPENDENT_AMBULATORY_CARE_PROVIDER_SITE_OTHER): Payer: 59 | Admitting: Adult Health

## 2022-01-03 ENCOUNTER — Encounter: Payer: Self-pay | Admitting: Adult Health

## 2022-01-03 ENCOUNTER — Other Ambulatory Visit: Payer: Self-pay

## 2022-01-03 VITALS — BP 128/86 | HR 85 | Ht 63.0 in | Wt 168.0 lb

## 2022-01-03 DIAGNOSIS — R399 Unspecified symptoms and signs involving the genitourinary system: Secondary | ICD-10-CM | POA: Insufficient documentation

## 2022-01-03 DIAGNOSIS — K649 Unspecified hemorrhoids: Secondary | ICD-10-CM | POA: Diagnosis not present

## 2022-01-03 DIAGNOSIS — R300 Dysuria: Secondary | ICD-10-CM

## 2022-01-03 LAB — POCT URINALYSIS DIPSTICK OB
Glucose, UA: NEGATIVE
Ketones, UA: NEGATIVE
Nitrite, UA: NEGATIVE
POC,PROTEIN,UA: NEGATIVE

## 2022-01-03 NOTE — Patient Instructions (Signed)
Hemorrhoids Hemorrhoids are swollen veins that may develop: In the butt (rectum). These are called internal hemorrhoids. Around the opening of the butt (anus). These are called external hemorrhoids. Hemorrhoids can cause pain, itching, or bleeding. Most of the time, they do not cause serious problems. They usually get better with diet changes, lifestyle changes, and other home treatments. What are the causes? This condition may be caused by: Having trouble pooping (constipation). Pushing hard (straining) to poop. Watery poop (diarrhea). Pregnancy. Being very overweight (obese). Sitting for long periods of time. Heavy lifting or other activity that causes you to strain. Anal sex. Riding a bike for a long period of time. What are the signs or symptoms? Symptoms of this condition include: Pain. Itching or soreness in the butt. Bleeding from the butt. Leaking poop. Swelling in the area. One or more lumps around the opening of your butt. How is this diagnosed? A doctor can often diagnose this condition by looking at the affected area. The doctor may also: Do an exam that involves feeling the area with a gloved hand (digital rectal exam). Examine the area inside your butt using a small tube (anoscope). Order blood tests. This may be done if you have lost a lot of blood. Have you get a test that involves looking inside the colon using a flexible tube with a camera on the end (sigmoidoscopy or colonoscopy). How is this treated? This condition can usually be treated at home. Your doctor may tell you to change what you eat, make lifestyle changes, or try home treatments. If these do not help, procedures can be done to remove the hemorrhoids or make them smaller. These may involve: Placing rubber bands at the base of the hemorrhoids to cut off their blood supply. Injecting medicine into the hemorrhoids to shrink them. Shining a type of light energy onto the hemorrhoids to cause them to fall  off. Doing surgery to remove the hemorrhoids or cut off their blood supply. Follow these instructions at home: Eating and drinking  Eat foods that have a lot of fiber in them. These include whole grains, beans, nuts, fruits, and vegetables. Ask your doctor about taking products that have added fiber (fibersupplements). Reduce the amount of fat in your diet. You can do this by: Eating low-fat dairy products. Eating less red meat. Avoiding processed foods. Drink enough fluid to keep your pee (urine) pale yellow. Managing pain and swelling  Take a warm-water bath (sitz bath) for 20 minutes to ease pain. Do this 3-4 times a day. You may do this in a bathtub or using a portable sitz bath that fits over the toilet. If told, put ice on the painful area. It may be helpful to use ice between your warm baths. Put ice in a plastic bag. Place a towel between your skin and the bag. Leave the ice on for 20 minutes, 2-3 times a day. General instructions Take over-the-counter and prescription medicines only as told by your doctor. Medicated creams and medicines may be used as told. Exercise often. Ask your doctor how much and what kind of exercise is best for you. Go to the bathroom when you have the urge to poop. Do not wait. Avoid pushing too hard when you poop. Keep your butt dry and clean. Use wet toilet paper or moist towelettes after pooping. Do not sit on the toilet for a long time. Keep all follow-up visits as told by your doctor. This is important. Contact a doctor if you: Have pain and   swelling that do not get better with treatment or medicine. ?Have trouble pooping. ?Cannot poop. ?Have pain or swelling outside the area of the hemorrhoids. ?Get help right away if you have: ?Bleeding that will not stop. ?Summary ?Hemorrhoids are swollen veins in the butt or around the opening of the butt. ?They can cause pain, itching, or bleeding. ?Eat foods that have a lot of fiber in them. These include  whole grains, beans, nuts, fruits, and vegetables. ?Take a warm-water bath (sitz bath) for 20 minutes to ease pain. Do this 3-4 times a day. ?This information is not intended to replace advice given to you by your health care provider. Make sure you discuss any questions you have with your health care provider. ?Document Revised: 04/27/2021 Document Reviewed: 04/27/2021 ?Elsevier Patient Education ? [place]. ? ?

## 2022-01-03 NOTE — Addendum Note (Signed)
Addended by: Dorita Sciara, Ellenor Wisniewski A on: 01/03/2022 11:46 AM ? ? Modules accepted: Orders ? ?

## 2022-01-03 NOTE — Progress Notes (Signed)
?  Subjective:  ?  ? Patient ID: Diana Blackburn, female   DOB: Apr 15, 1986, 36 y.o.   MRN: [id number] ? ?HPI ?Diana Blackburn is a 36 year old female, married, G2P0202, in complaining of bump in rectal area, no pain, itching or burning. Does have burning with urination.  ?PCP is Dr Verdie Drown  ?Lab Results  ?Component Value Date  ? DIAGPAP  04/05/2020  ?  - Negative for intraepithelial lesion or malignancy (NILM)  ? HPV NOT DETECTED 10/16/2017  ? Sac Negative 04/05/2020  ?  ?Review of Systems ?Has bump rectal area noticed in shower the weekend, no pain,itching or burning  ?Has some burning when pees ?Reviewed past medical,surgical, social and family history. Reviewed medications and allergies.  ?   ?Objective:  ? Physical Exam ?BP 128/86 (BP Location: Right Arm, Patient Position: Sitting, Cuff Size: Normal)   Pulse 85   Ht '5\' 3"'$  (1.6 m)   Wt 168 lb (76.2 kg)   LMP 12/24/2021 (Approximate)   BMI 29.76 kg/m?   ?  Urine dipstick: small blood, small leuks  ?Skin warm and dry, small hemorrhoid seen when anal tissue stretched  ?Examination chaperoned by Marcelino Scot RN ? ? [facility] - 01/03/22 1106   ? ?  ? Pregnancy Intention Screening  ? Does the patient want to become pregnant in the next year? N/A   ? Does the patient's partner want to become pregnant in the next year? N/A   ? Would the patient like to discuss contraceptive options today? N/A   ?  ? Contraception Wrap Up  ? Current Method Female Sterilization   ? End Method Female Sterilization   ? Contraception Counseling Provided No   ? ?  ?  ? ?  ?  ?Assessment:  ?   ?1. Burning with urination ? ?2. Symptoms of urinary tract infection ?Will send urine  UA C&S to rule out UTI  ? ?3. Hemorrhoids, unspecified hemorrhoid type ?Can use preparation H and tucks, warm bath, review handout on hemorrhoids  ?   ?Plan:  ?   ?Follow up prn  ?   ?

## 2022-01-04 LAB — URINALYSIS, ROUTINE W REFLEX MICROSCOPIC
Bilirubin, UA: NEGATIVE
Glucose, UA: NEGATIVE
Ketones, UA: NEGATIVE
Nitrite, UA: NEGATIVE
Protein,UA: NEGATIVE
Specific Gravity, UA: <=1.005 — AB (ref 1.005–1.030)
Urobilinogen, Ur: 0.2 mg/dL (ref 0.2–1.0)
pH, UA: 7.5 (ref 5.0–7.5)

## 2022-01-04 LAB — MICROSCOPIC EXAMINATION
Bacteria, UA: NONE SEEN
Casts: NONE SEEN /lpf
RBC, Urine: NONE SEEN /hpf (ref 0–2)

## 2022-01-05 LAB — URINE CULTURE

## 2022-04-27 ENCOUNTER — Ambulatory Visit: Payer: 59 | Admitting: Adult Health

## 2022-05-30 ENCOUNTER — Ambulatory Visit: Payer: 59 | Admitting: Adult Health

## 2022-07-05 DIAGNOSIS — M25532 Pain in left wrist: Secondary | ICD-10-CM | POA: Diagnosis not present

## 2022-07-05 DIAGNOSIS — E559 Vitamin D deficiency, unspecified: Secondary | ICD-10-CM | POA: Diagnosis not present

## 2022-07-05 DIAGNOSIS — Z0000 Encounter for general adult medical examination without abnormal findings: Secondary | ICD-10-CM | POA: Diagnosis not present

## 2022-07-05 DIAGNOSIS — R202 Paresthesia of skin: Secondary | ICD-10-CM | POA: Diagnosis not present

## 2022-07-05 DIAGNOSIS — F419 Anxiety disorder, unspecified: Secondary | ICD-10-CM | POA: Diagnosis not present

## 2022-07-11 ENCOUNTER — Other Ambulatory Visit (HOSPITAL_COMMUNITY)
Admission: RE | Admit: 2022-07-11 | Discharge: 2022-07-11 | Disposition: A | Discharge status: Home / Self Care | Payer: BC Managed Care – PPO | Source: Ambulatory Visit | Attending: Adult Health | Admitting: Adult Health

## 2022-07-11 ENCOUNTER — Ambulatory Visit (INDEPENDENT_AMBULATORY_CARE_PROVIDER_SITE_OTHER): Payer: BC Managed Care – PPO | Admitting: Adult Health

## 2022-07-11 ENCOUNTER — Encounter: Payer: Self-pay | Admitting: Adult Health

## 2022-07-11 VITALS — BP 107/74 | HR 84 | Ht 63.0 in | Wt 173.5 lb

## 2022-07-11 DIAGNOSIS — Z01419 Encounter for gynecological examination (general) (routine) without abnormal findings: Secondary | ICD-10-CM

## 2022-07-11 NOTE — Progress Notes (Signed)
Patient ID: Diana Blackburn, female   DOB: 10/02/1986, 36 y.o.   MRN: [id number] History of Present Illness: Diana Blackburn is a 36 year old white female,married, [id number], in for a well woman gyn exam and pap.  PCP is Dr Verdie Drown   Current Medications, Allergies, Past Medical History, Past Surgical History, Family History and Social History were reviewed in [place] [facility] record.     Review of Systems: Patient denies any headaches, hearing loss, fatigue, blurred vision, shortness of breath, chest pain, abdominal pain, problems with bowel movements, urination, or intercourse. No joint pain or mood swings.     Physical Exam:BP 107/74 (BP Location: Left Arm, Patient Position: Sitting, Cuff Size: Normal)   Pulse 84   Ht '5\' 3"'$  (1.6 m)   Wt 173 lb 8 oz (78.7 kg)   LMP 06/28/2022 (Approximate)   BMI 30.73 kg/m   General:  Well developed, well nourished, no acute distress Skin:  Warm and dry Neck:  Midline trachea, normal thyroid, good ROM, no lymphadenopathy Lungs; Clear to auscultation bilaterally Breast:  No dominant palpable mass, retraction, or nipple discharge, has regular irregularities esp right breast  Cardiovascular: Regular rate and rhythm Abdomen:  Soft, non tender, no hepatosplenomegaly Pelvic:  External genitalia is normal in appearance, no lesions.  The vagina is normal in appearance. Urethra has no lesions or masses. The cervix is bulbous. Pap with GC/CHL and HR HPV genotyping performed.  Uterus is felt to be normal size, shape, and contour.  No adnexal masses or tenderness noted.Bladder is non tender, no masses felt. Extremities/musculoskeletal:  No swelling or varicosities noted, no clubbing or cyanosis Psych:  No mood changes, alert and cooperative,seems happy AA is 2 Fall risk is moderate    07/11/2022   10:56 AM 04/06/2021    8:33 AM 04/05/2020   11:34 AM  Depression screen PHQ 2/9  Decreased Interest 0 1 0  Down, Depressed, Hopeless 0 0 0  PHQ - 2 Score 0 1 0   Altered sleeping 0 0 0  Tired, decreased energy 1 0 1  Change in appetite 0 0 0  Feeling bad or failure about yourself  0 0 1  Trouble concentrating 0 0 0  Moving slowly or fidgety/restless 0 0 0  Suicidal thoughts 0 0 0  PHQ-9 Score '1 1 2  '$ Difficult doing work/chores   Not difficult at all       07/11/2022   10:56 AM 04/06/2021    8:33 AM 04/05/2020   11:38 AM  GAD 7 : Generalized Anxiety Score  Nervous, Anxious, on Edge 0 0 0  Control/stop worrying 1 0 1  Worry too much - different things 1 0 1  Trouble relaxing 0 0 0  Restless 2 0 0  Easily annoyed or irritable 0 0 0  Afraid - awful might happen 1 0 1  Total GAD 7 Score 5 0 3  Anxiety Difficulty   Not difficult at all    Denies anxiety,maybe some stress at times,no meds needed she says   [facility] - 07/11/22 1052       Pregnancy Intention Screening   Does the patient want to become pregnant in the next year? No    Does the patient's partner want to become pregnant in the next year? No    Would the patient like to discuss contraceptive options today? No      Contraception Wrap Up   Current Method Female Sterilization    End Method Female Sterilization  Examination chaperoned by Levy Pupa LPN   Impression and Plan: 1. Encounter for gynecological examination with Papanicolaou smear of cervix Pap sent Pap in 3 years if normal Physical in 1 years Labs with PCP last week, has follow up on Thursday on results  Stay active

## 2022-07-13 DIAGNOSIS — E785 Hyperlipidemia, unspecified: Secondary | ICD-10-CM | POA: Diagnosis not present

## 2022-07-17 LAB — CYTOLOGY - PAP
Chlamydia: NEGATIVE
Comment: NEGATIVE
Comment: NEGATIVE
Comment: NORMAL
Diagnosis: NEGATIVE
High risk HPV: NEGATIVE
Neisseria Gonorrhea: NEGATIVE

## 2023-01-11 ENCOUNTER — Ambulatory Visit: Payer: 59 | Admitting: Adult Health

## 2023-01-11 ENCOUNTER — Encounter: Payer: Self-pay | Admitting: Adult Health

## 2023-01-11 VITALS — BP 114/76 | HR 73 | Ht 63.0 in | Wt 177.0 lb

## 2023-01-11 DIAGNOSIS — R229 Localized swelling, mass and lump, unspecified: Secondary | ICD-10-CM | POA: Diagnosis not present

## 2023-01-11 NOTE — Progress Notes (Signed)
  Subjective:     Patient ID: Diana Blackburn, female   DOB: 02-26-1986, 37 y.o.   MRN: [id number]  HPI Diana Blackburn is a 37 year old female, married, G2P0202, in for lump left labia, felt in shower.  Last pap was negative HPV and NILM 07/11/22  PCP is Dr Verdie Drown  Review of Systems Has lump left labia  Reviewed past medical,surgical, social and family history. Reviewed medications and allergies.     Objective:   Physical Exam BP 114/76 (BP Location: Left Arm, Patient Position: Sitting, Cuff Size: Normal)   Pulse 73   Ht 5\' 3"  (1.6 m)   Wt 177 lb (80.3 kg)   LMP 12/25/2022   BMI 31.35 kg/m     Skin warm and dry.Pelvic: external genitalia is normal in appearance no lesions, has 3 mm round smooth, nodule under the skin on mons pubis near labia, ?fatty nodule, non tender, no redness of skin. Fall risk is moderate  [facility] - 01/11/23 1020       Pregnancy Intention Screening   Does the patient want to become pregnant in the next year? No    Does the patient's partner want to become pregnant in the next year? No    Would the patient like to discuss contraceptive options today? No      Contraception Wrap Up   Current Method Female Sterilization    End Method Female Sterilization             Examination chaperoned by Levy Pupa LPN  Assessment:     1. Subcutaneous nodule Leave alone and follow for now     Plan:     Follow up prn

## 2023-03-02 DIAGNOSIS — R350 Frequency of micturition: Secondary | ICD-10-CM | POA: Diagnosis not present

## 2023-03-02 DIAGNOSIS — N390 Urinary tract infection, site not specified: Secondary | ICD-10-CM | POA: Diagnosis not present

## 2023-05-17 DIAGNOSIS — Z13220 Encounter for screening for lipoid disorders: Secondary | ICD-10-CM | POA: Diagnosis not present

## 2023-05-18 DIAGNOSIS — Z13220 Encounter for screening for lipoid disorders: Secondary | ICD-10-CM | POA: Diagnosis not present

## 2023-05-18 DIAGNOSIS — Z0000 Encounter for general adult medical examination without abnormal findings: Secondary | ICD-10-CM | POA: Diagnosis not present

## 2023-05-18 DIAGNOSIS — E669 Obesity, unspecified: Secondary | ICD-10-CM | POA: Diagnosis not present

## 2023-05-18 DIAGNOSIS — Z131 Encounter for screening for diabetes mellitus: Secondary | ICD-10-CM | POA: Diagnosis not present

## 2023-08-13 ENCOUNTER — Ambulatory Visit: Payer: Self-pay | Admitting: Adult Health

## 2024-03-18 ENCOUNTER — Ambulatory Visit: Payer: Self-pay | Admitting: Adult Health

## 2024-03-18 ENCOUNTER — Encounter: Payer: Self-pay | Admitting: Adult Health

## 2024-03-18 VITALS — BP 128/81 | HR 76 | Ht 63.0 in | Wt 176.0 lb

## 2024-03-18 DIAGNOSIS — Z01419 Encounter for gynecological examination (general) (routine) without abnormal findings: Secondary | ICD-10-CM

## 2024-03-18 DIAGNOSIS — N6312 Unspecified lump in the right breast, upper inner quadrant: Secondary | ICD-10-CM

## 2024-03-18 DIAGNOSIS — Z13220 Encounter for screening for lipoid disorders: Secondary | ICD-10-CM

## 2024-03-18 DIAGNOSIS — R1032 Left lower quadrant pain: Secondary | ICD-10-CM

## 2024-03-18 NOTE — Progress Notes (Signed)
 Patient ID: Diana Blackburn, female   DOB: Dec 29, 1985, 38 y.o.   MRN: [id number] History of Present Illness: Diana Blackburn is a 38 year old female,married, G2P0202, in for a well woman gyn exam.      Component Value Date/Time   DIAGPAP  07/11/2022 1059    - Negative for intraepithelial lesion or malignancy (NILM)   DIAGPAP  04/05/2020 1143    - Negative for intraepithelial lesion or malignancy (NILM)   DIAGPAP  10/16/2017 0000    NEGATIVE FOR INTRAEPITHELIAL LESIONS OR MALIGNANCY.   HPVHIGH Negative 07/11/2022 1059   HPVHIGH Negative 04/05/2020 1143   ADEQPAP  07/11/2022 1059    Satisfactory for evaluation; transformation zone component PRESENT.   ADEQPAP  04/05/2020 1143    Satisfactory for evaluation; transformation zone component ABSENT.   ADEQPAP  10/16/2017 0000    Satisfactory for evaluation  endocervical/transformation zone component PRESENT.     Current Medications, Allergies, Past Medical History, Past Surgical History, Family History and Social History were reviewed in [facility] record.     Review of Systems: Patient denies any headaches, hearing loss, fatigue, blurred vision, shortness of breath, chest pain, abdominal pain, problems with bowel movements, or urination. No joint pain or mood swings.  Has had LLQ pain esp after sex and some pressure after sex Spots some after sex too Has pain in chest wall at times, goes away when pressure applied   Physical Exam:BP 128/81 (BP Location: Left Arm, Patient Position: Sitting, Cuff Size: Normal)   Pulse 76   Ht 5\' 3"  (1.6 m)   Wt 176 lb (79.8 kg)   LMP 03/03/2024 (Exact Date)   BMI 31.18 kg/m   General:  Well developed, well nourished, no acute distress Skin:  Warm and dry Neck:  Midline trachea, normal thyroid , good ROM, no lymphadenopathy Lungs; Clear to auscultation bilaterally Breast:  No dominant palpable mass, retraction, or nipple discharge on the left, on the right has mass at about 3 0'clock 4 FB  from areola(she thinks it feels bigger) does have fibroglandular tissue, no retraction or nipple discharge  Cardiovascular: Regular rate and rhythm Abdomen:  Soft, non tender, no hepatosplenomegaly Pelvic:  External genitalia is normal in appearance, no lesions.  The vagina is normal in appearance. Urethra has no lesions or masses. The cervix is bulbous.  Uterus is felt to be normal size, shape, and contour.  No adnexal masses, +LLQ  tenderness noted.Bladder is non tender, no masses felt. Extremities/musculoskeletal:  No swelling or varicosities noted, no clubbing or cyanosis Psych:  No mood changes, alert and cooperative,seems happy AA is 3    03/18/2024    9:56 AM 07/11/2022   10:56 AM 04/06/2021    8:33 AM  Depression screen PHQ 2/9  Decreased Interest 0 0 1  Down, Depressed, Hopeless 0 0 0  PHQ - 2 Score 0 0 1  Altered sleeping 1 0 0  Tired, decreased energy 1 1 0  Change in appetite 1 0 0  Feeling bad or failure about yourself  0 0 0  Trouble concentrating 0 0 0  Moving slowly or fidgety/restless 0 0 0  Suicidal thoughts 0 0 0  PHQ-9 Score 3 1 1        03/18/2024    9:56 AM 07/11/2022   10:56 AM 04/06/2021    8:33 AM 04/05/2020   11:38 AM  GAD 7 : Generalized Anxiety Score  Nervous, Anxious, on Edge 0 0 0 0  Control/stop worrying 0 1 0  1  Worry too much - different things 0 1 0 1  Trouble relaxing 0 0 0 0  Restless 0 2 0 0  Easily annoyed or irritable 0 0 0 0  Afraid - awful might happen 0 1 0 1  Total GAD 7 Score 0 5 0 3  Anxiety Difficulty    Not difficult at all    [facility] - 03/18/24 0955       Pregnancy Intention Screening   Does the patient want to become pregnant in the next year? No    Does the patient's partner want to become pregnant in the next year? No    Would the patient like to discuss contraceptive options today? No      Contraception Wrap Up   Current Method Female Sterilization    End Method Female Sterilization    Contraception Counseling Provided No     How was the end contraceptive method provided? N/A              Examination chaperoned by Carolin Preston Rn   Impression and plan: 1. Encounter for well woman exam with routine gynecological exam (Primary) Pap and physical in 1 year Will check labs  - CBC - Comprehensive metabolic panel with GFR - Lipid panel  2. Mass of upper inner quadrant of right breast has mass at about 3 0'clock 4 FB from areola(she thinks it feels bigger) does have fibroglandular tissue, no retraction or nipple discharge Diagnostic mammogram and US  scheduled for 04/03/24 at [facility] at 9:40 am  - US  LIMITED ULTRASOUND INCLUDING AXILLA RIGHT BREAST; Future - MM 3D DIAGNOSTIC MAMMOGRAM BILATERAL BREAST; Future - US  LIMITED ULTRASOUND INCLUDING AXILLA LEFT BREAST ; Future  3. LLQ pain LLQ pain esp after sex, over ovary area Will get pelvic US  in office to assess in about 3 weeks  - US  PELVIC COMPLETE WITH TRANSVAGINAL; Future  4. Screening cholesterol level - Lipid panel

## 2024-03-19 LAB — COMPREHENSIVE METABOLIC PANEL WITH GFR
ALT: 24 IU/L (ref 0–32)
AST: 18 IU/L (ref 0–40)
Albumin: 4.3 g/dL (ref 3.9–4.9)
Alkaline Phosphatase: 79 IU/L (ref 44–121)
BUN/Creatinine Ratio: 19 (ref 9–23)
BUN: 12 mg/dL (ref 6–20)
Bilirubin Total: 0.4 mg/dL (ref 0.0–1.2)
CO2: 20 mmol/L (ref 20–29)
Calcium: 9.2 mg/dL (ref 8.7–10.2)
Chloride: 103 mmol/L (ref 96–106)
Creatinine, Ser: 0.63 mg/dL (ref 0.57–1.00)
Globulin, Total: 2.8 g/dL (ref 1.5–4.5)
Glucose: 93 mg/dL (ref 70–99)
Potassium: 4.4 mmol/L (ref 3.5–5.2)
Sodium: 140 mmol/L (ref 134–144)
Total Protein: 7.1 g/dL (ref 6.0–8.5)
eGFR: 118 mL/min/{1.73_m2} (ref >59)

## 2024-03-19 LAB — CBC
Hematocrit: 39.8 % (ref 34.0–46.6)
Hemoglobin: 12 g/dL (ref 11.1–15.9)
MCH: 27.6 pg (ref 26.6–33.0)
MCHC: 30.2 g/dL — ABNORMAL LOW (ref 31.5–35.7)
MCV: 92 fL (ref 79–97)
Platelets: 352 10*3/uL (ref 150–450)
RBC: 4.34 x10E6/uL (ref 3.77–5.28)
RDW: 12.7 % (ref 11.7–15.4)
WBC: 9.7 10*3/uL (ref 3.4–10.8)

## 2024-03-19 LAB — LIPID PANEL
Chol/HDL Ratio: 3.9 ratio (ref 0.0–4.4)
Cholesterol, Total: 175 mg/dL (ref 100–199)
HDL: 45 mg/dL (ref >39)
LDL Chol Calc (NIH): 117 mg/dL — ABNORMAL HIGH (ref 0–99)
Triglycerides: 66 mg/dL (ref 0–149)
VLDL Cholesterol Cal: 13 mg/dL (ref 5–40)

## 2024-03-20 ENCOUNTER — Ambulatory Visit: Payer: Self-pay | Admitting: Adult Health

## 2024-03-25 ENCOUNTER — Ambulatory Visit (HOSPITAL_COMMUNITY)
Admission: RE | Admit: 2024-03-25 | Discharge: 2024-03-25 | Discharge status: Home / Self Care | Source: Ambulatory Visit | Attending: Adult Health | Admitting: Adult Health

## 2024-03-25 ENCOUNTER — Encounter (HOSPITAL_COMMUNITY): Payer: Self-pay

## 2024-03-25 ENCOUNTER — Ambulatory Visit (HOSPITAL_COMMUNITY)
Admission: RE | Admit: 2024-03-25 | Discharge: 2024-03-25 | Disposition: A | Discharge status: Home / Self Care | Source: Ambulatory Visit | Attending: Adult Health | Admitting: Adult Health

## 2024-03-25 DIAGNOSIS — N6312 Unspecified lump in the right breast, upper inner quadrant: Secondary | ICD-10-CM | POA: Insufficient documentation

## 2024-04-03 ENCOUNTER — Ambulatory Visit (HOSPITAL_COMMUNITY)

## 2024-04-03 ENCOUNTER — Encounter (HOSPITAL_COMMUNITY)

## 2024-04-08 ENCOUNTER — Ambulatory Visit

## 2024-04-08 DIAGNOSIS — R1032 Left lower quadrant pain: Secondary | ICD-10-CM | POA: Diagnosis not present

## 2024-04-08 NOTE — Progress Notes (Signed)
 PELVIC US  TA/TV: heterogeneous anteverted uterus,asymmetrical anterior myometrial thickening,posterior fundal calcified subserosal fibroid 1.2 x 1.1 x .6cm,EEC 7.8 mm,normal ovaries,ovaries appear mobile,no free fluid,no pain during ultrasound  Chaperone [facility]
